# Patient Record
Sex: Female | Born: 1970 | Race: Black or African American | Hispanic: No | Marital: Married | Smoking: Never smoker
Health system: Southern US, Community
[De-identification: ages and names within clinical notes are randomized; demographics above are authoritative.]

## PROBLEM LIST (undated history)

## (undated) DIAGNOSIS — N870 Mild cervical dysplasia: Secondary | ICD-10-CM

## (undated) DIAGNOSIS — E221 Hyperprolactinemia: Secondary | ICD-10-CM

## (undated) DIAGNOSIS — E210 Primary hyperparathyroidism: Secondary | ICD-10-CM

## (undated) DIAGNOSIS — K429 Umbilical hernia without obstruction or gangrene: Secondary | ICD-10-CM

## (undated) HISTORY — DX: Umbilical hernia without obstruction or gangrene: K42.9

## (undated) HISTORY — PX: OTHER SURGICAL HISTORY: SHX169

## (undated) HISTORY — DX: Hyperprolactinemia: E22.1

## (undated) HISTORY — DX: Mild cervical dysplasia: N87.0

## (undated) HISTORY — DX: Primary hyperparathyroidism: E21.0

## (undated) HISTORY — DX: Hypocalcemia: E83.51

---

## 2003-01-23 HISTORY — PX: PARATHYROIDECTOMY: SHX19

## 2003-10-07 ENCOUNTER — Ambulatory Visit (HOSPITAL_COMMUNITY): Admission: RE | Admit: 2003-10-07 | Discharge: 2003-10-08 | Payer: Self-pay | Admitting: Surgery

## 2003-10-07 ENCOUNTER — Encounter (INDEPENDENT_AMBULATORY_CARE_PROVIDER_SITE_OTHER): Payer: Self-pay | Admitting: *Deleted

## 2003-11-22 ENCOUNTER — Other Ambulatory Visit: Admission: RE | Admit: 2003-11-22 | Discharge: 2003-11-22 | Payer: Self-pay | Admitting: Obstetrics and Gynecology

## 2004-01-22 ENCOUNTER — Emergency Department (HOSPITAL_COMMUNITY): Admission: EM | Admit: 2004-01-22 | Discharge: 2004-01-23 | Payer: Self-pay | Admitting: Emergency Medicine

## 2004-02-22 ENCOUNTER — Encounter: Admission: RE | Admit: 2004-02-22 | Discharge: 2004-02-22 | Payer: Self-pay | Admitting: Obstetrics and Gynecology

## 2004-11-23 ENCOUNTER — Other Ambulatory Visit: Admission: RE | Admit: 2004-11-23 | Discharge: 2004-11-23 | Payer: Self-pay | Admitting: Obstetrics and Gynecology

## 2005-04-05 ENCOUNTER — Other Ambulatory Visit: Admission: RE | Admit: 2005-04-05 | Discharge: 2005-04-05 | Payer: Self-pay | Admitting: Gynecology

## 2005-05-04 ENCOUNTER — Ambulatory Visit (HOSPITAL_COMMUNITY): Admission: RE | Admit: 2005-05-04 | Discharge: 2005-05-04 | Payer: Self-pay | Admitting: Gynecology

## 2005-07-30 ENCOUNTER — Encounter: Admission: RE | Admit: 2005-07-30 | Discharge: 2005-07-30 | Payer: Self-pay | Admitting: Gynecology

## 2005-10-10 ENCOUNTER — Inpatient Hospital Stay (HOSPITAL_COMMUNITY): Admission: RE | Admit: 2005-10-10 | Discharge: 2005-10-13 | Payer: Self-pay | Admitting: Gynecology

## 2005-10-10 ENCOUNTER — Encounter (INDEPENDENT_AMBULATORY_CARE_PROVIDER_SITE_OTHER): Payer: Self-pay | Admitting: *Deleted

## 2005-11-21 ENCOUNTER — Other Ambulatory Visit: Admission: RE | Admit: 2005-11-21 | Discharge: 2005-11-21 | Payer: Self-pay | Admitting: Gynecology

## 2006-09-09 ENCOUNTER — Encounter: Admission: RE | Admit: 2006-09-09 | Discharge: 2006-09-09 | Payer: Self-pay | Admitting: Surgery

## 2006-12-02 ENCOUNTER — Other Ambulatory Visit: Admission: RE | Admit: 2006-12-02 | Discharge: 2006-12-02 | Payer: Self-pay | Admitting: Gynecology

## 2007-01-03 ENCOUNTER — Encounter: Admission: RE | Admit: 2007-01-03 | Discharge: 2007-01-03 | Payer: Self-pay | Admitting: Endocrinology

## 2007-03-27 ENCOUNTER — Encounter (INDEPENDENT_AMBULATORY_CARE_PROVIDER_SITE_OTHER): Payer: Self-pay | Admitting: Surgery

## 2007-03-27 ENCOUNTER — Ambulatory Visit (HOSPITAL_COMMUNITY): Admission: RE | Admit: 2007-03-27 | Discharge: 2007-03-28 | Payer: Self-pay | Admitting: Surgery

## 2007-12-10 ENCOUNTER — Ambulatory Visit: Payer: Self-pay | Admitting: Gynecology

## 2007-12-10 ENCOUNTER — Encounter: Payer: Self-pay | Admitting: Gynecology

## 2007-12-10 ENCOUNTER — Other Ambulatory Visit: Admission: RE | Admit: 2007-12-10 | Discharge: 2007-12-10 | Payer: Self-pay | Admitting: Gynecology

## 2008-02-03 ENCOUNTER — Encounter: Admission: RE | Admit: 2008-02-03 | Discharge: 2008-02-03 | Payer: Self-pay | Admitting: Gynecology

## 2008-02-09 ENCOUNTER — Encounter: Admission: RE | Admit: 2008-02-09 | Discharge: 2008-02-09 | Payer: Self-pay | Admitting: Gynecology

## 2008-02-11 ENCOUNTER — Ambulatory Visit: Payer: Self-pay | Admitting: Gynecology

## 2008-03-22 ENCOUNTER — Ambulatory Visit: Payer: Self-pay | Admitting: Gynecology

## 2008-03-23 ENCOUNTER — Encounter: Admission: RE | Admit: 2008-03-23 | Discharge: 2008-03-23 | Payer: Self-pay | Admitting: Gynecology

## 2008-03-30 ENCOUNTER — Ambulatory Visit: Payer: Self-pay | Admitting: Gynecology

## 2008-05-03 ENCOUNTER — Ambulatory Visit: Payer: Self-pay | Admitting: Gynecology

## 2008-07-09 ENCOUNTER — Ambulatory Visit: Payer: Self-pay | Admitting: Gynecology

## 2008-08-17 ENCOUNTER — Ambulatory Visit: Payer: Self-pay | Admitting: Gynecology

## 2008-09-28 ENCOUNTER — Ambulatory Visit: Payer: Self-pay | Admitting: Gynecology

## 2008-10-09 ENCOUNTER — Ambulatory Visit (HOSPITAL_COMMUNITY): Admission: RE | Admit: 2008-10-09 | Discharge: 2008-10-09 | Payer: Self-pay | Admitting: Gynecology

## 2008-11-30 ENCOUNTER — Ambulatory Visit: Payer: Self-pay | Admitting: Gynecology

## 2008-12-20 ENCOUNTER — Ambulatory Visit: Payer: Self-pay | Admitting: Gynecology

## 2008-12-20 ENCOUNTER — Other Ambulatory Visit: Admission: RE | Admit: 2008-12-20 | Discharge: 2008-12-20 | Payer: Self-pay | Admitting: Gynecology

## 2009-02-08 ENCOUNTER — Ambulatory Visit: Payer: Self-pay | Admitting: Gynecology

## 2009-04-04 ENCOUNTER — Ambulatory Visit: Payer: Self-pay | Admitting: Gynecology

## 2009-04-04 ENCOUNTER — Other Ambulatory Visit: Admission: RE | Admit: 2009-04-04 | Discharge: 2009-04-04 | Payer: Self-pay | Admitting: Gynecology

## 2009-12-21 ENCOUNTER — Ambulatory Visit: Payer: Self-pay | Admitting: Gynecology

## 2009-12-21 ENCOUNTER — Other Ambulatory Visit
Admission: RE | Admit: 2009-12-21 | Discharge: 2009-12-21 | Payer: Self-pay | Source: Home / Self Care | Admitting: Gynecology

## 2009-12-27 ENCOUNTER — Ambulatory Visit: Payer: Self-pay | Admitting: Gynecology

## 2010-02-12 ENCOUNTER — Encounter: Payer: Self-pay | Admitting: Surgery

## 2010-06-06 NOTE — Op Note (Signed)
[name], [name]                  ACCOUNT NO.:  [id number]   MEDICAL RECORD NO.:  [id number]          PATIENT TYPE:  OIB   LOCATION:  2550                         FACILITY:  [facility]   PHYSICIAN:  Velora Heckler, MD      DATE OF BIRTH:  [birth year]   DATE OF PROCEDURE:  03/27/2007  DATE OF DISCHARGE:                               OPERATIVE REPORT   PREOPERATIVE DIAGNOSIS:  Primary hyperparathyroidism, recurrent   POSTOPERATIVE DIAGNOSIS:  Primary hyperparathyroidism, recurrent   PROCEDURE:  1. Neck exploration.  2. Right superior parathyroidectomy (adenoma)  3. Left superior parathyroidectomy (hyperplasia)  4. Autotransplantation parathyroid tissue to the left      sternocleidomastoid muscle.   SURGEON:  Velora Heckler, MD, FACS   ANESTHESIA:  General per Dr. Sheldon Silvan.   ESTIMATED BLOOD LOSS:  Minimal.   PREPARATION:  Betadine.   COMPLICATIONS:  None.   INDICATIONS:  Patient is 40 year old black female well known to my  practice.  She had undergone minimally invasive right inferior  parathyroidectomy in October 2005 for a 1000 mg 1.8 cm parathyroid  adenoma.  Following the procedure she had persistent elevated  parathyroid and calcium levels.  Follow-up sestamibi scan demonstrated  increased uptake in the left inferior position and a second adenoma was  suspected.  The patient now comes to surgery for exploration and  resection.   BODY OF REPORT:  Procedure was done in OR #16 at [facility].  The patient was brought to the operating room, placed in  supine position on the operating room table.  Following administration  of general anesthesia the patient is positioned and then prepped and  draped in the usual strict aseptic fashion.  After ascertaining that an  adequate level of anesthesia been achieved, a left inferior neck  incision was made a #15 blade.  Dissection was carried through  subcutaneous tissues and platysma.  Hemostasis was obtained  with  electrocautery.  Skin flaps were elevated cephalad and caudad and a  Weitlaner retractor was placed for exposure.  Strap muscles were incised  in the midline and reflected laterally.  Inferior pole of the left  thyroid lobe was exposed.  Dissection reveals a nodular density adherent  to the trachea just below the inferior pole of the left thyroid lobe.  This was gently dissected out taking care to avoid the recurrent nerve  which is visualized.  Nodule was excised and submitted to pathology.  Dr. Star Age evaluated it and the stated that this represented benign  thyroid tissue.  Further exploration along the tracheoesophageal groove  was unrevealing.  Left thyroid lobe was mobilized and dissection behind  the lobe revealed a enlarged appearing gland which was above the level  of the inferior thyroid artery likely representing a left superior  parathyroid.  However, it was dissected out.  Vascular structures were  divided between small hemoclips.  Gland was excised.  It appeared to be  approximately 1 cm in greatest dimension.  It was submitted to pathology  where Dr. Star Age confirmed hyperplastic parathyroid tissue  but not  consistent with parathyroid adenoma.  Therefore, further dissection was  performed in the tracheoesophageal groove and inferiorly.  The  thyrothymic tract was dissected out beyond the level of the innominate  vein on the left.  It was resected and submitted separately to pathology  for review.  No parathyroid tissue was grossly identified.  Further  dissection included opening the carotid sheath.  The esophagus was  skeletonized.  The esophagus was then mobilized and upon full  mobilization of the esophagus, an enlarged parathyroid gland was noted  in the retroesophageal space on the precervical fascia.  This was  mobilized.  It was gently dissected out.  However its vascular pedicle  tracked upwards and to the right.  This gland likely represents a right   superior parathyroid gland which she has dropped into the space behind  the esophagus and developed an adenoma.  It was resected in its entirety  with hemostasis obtained between Ligaclips.   Due to concerns over possibly rendering the patient with no viable  parathyroid tissue, a small section of parathyroid tissue is excised off  of the gland containing the adenoma.  The tissue is selected from the  vascular pedicle end of the gland away from the enlarged portion of  representing adenoma.  This tissue is then sectioned into 1 mm fragments  and six of these fragments were implanted into the left  sternocleidomastoid muscle and secured with a 3-0 Vicryl figure-of-eight  suture marked with two medium hemoclips at the site.  The site is  immediately beneath the lateral portion of the incision on the skin.   The gland is submitted to pathology and Dr. Berneta Levins confirms  parathyroid tissue consistent with parathyroid adenoma.   Good hemostasis was obtained throughout the neck.  Surgicel was placed  throughout the operative field.  Strap muscles are repaired with  interrupted 3-0 Vicryl sutures.  Strap muscles were reapproximated in  the midline with interrupted 3-0 Vicryl sutures.  Platysma was closed  with interrupted 3-0 Vicryl sutures.  Skin was anesthetized with local  Marcaine.  Skin is closed with running 4-0 Monocryl subcuticular suture.  Wound is washed and dried and Benzoin Steri-Strips were applied.  Sterile dressings were applied.  The patient is awakened from anesthesia  and brought to the recovery room in stable condition.  The patient  tolerated the procedure well.      Velora Heckler, MD  Electronically Signed     TMG/MEDQ  D:  03/27/2007  T:  03/27/2007  Job:  [id number]   cc:   Gaetano Hawthorne. Lily Peer, M.D.  Veverly Fells. Altheimer, M.D.  Jonita Albee, M.D.

## 2010-06-09 NOTE — Op Note (Signed)
[name], [name]                  ACCOUNT NO.:  [id number]   MEDICAL RECORD NO.:  [id number]          PATIENT TYPE:  INP   LOCATION:  9139                          [facility]   PHYSICIAN:  Juan H. Lily Peer, M.D.DATE OF BIRTH:  1970-12-11   DATE OF PROCEDURE:  10/10/2005  DATE OF DISCHARGE:                                 OPERATIVE REPORT   SURGEON:  Juan H. Lily Peer, M.D.   FIRST ASSISTANT:  Ivor Costa. Farrel Gobble, M.D.   INDICATIONS FOR OPERATION:  40 year old gravida 3, para 2, abortus 1 with  prior cesarean section for repeat.  Fetus in the frank breech presentation  along with symmetrical intrauterine growth restriction. The patient also  gestational diabetic on oral hypoglycemic agents.   PREOPERATIVE DIAGNOSIS:  1. Term intrauterine pregnancy.  2. Gestational diabetes.  3. Previous cesarean section.  4. Breech presentation.  5. Symmetrical IUGR.   POSTOPERATIVE DIAGNOSIS:  1. Term intrauterine pregnancy.  2. Gestational diabetes.  3. Previous cesarean section.  4. Breech presentation.  5. Symmetrical IUGR.   PROCEDURE:  Repeat lower uterine segment transverse cesarean section.   FINDINGS:  A viable female infant, Apgars of 09/09 with a weight of 5 pounds 2  ounces, nuchal cord x1, clear amniotic fluid.  Normal maternal pelvic  anatomy.   DESCRIPTION OF ANESTHESIA:  Was spinal.   DESCRIPTION OF OPERATION:  After the patient adequately counseled she was  taken to the operating room where she underwent successful spinal  anesthesia. The patient's abdomen, vagina and perineum were prepped and  draped in usual sterile fashion.  A Foley catheter had been inserted in  effort to monitor urinary output.  After adequate spinal anesthesia level  was obtained, the patient was placed in the supine position.  The abdomen  was prepped and draped in usual sterile fashion.  A Pfannenstiel skin  incision was made adjacent to previous Pfannenstiel scar.  Of note 0.25%  Marcaine  for 10 mL was infiltrated into the planned cesarean section  incision site for postop analgesia.  The incision was carried out from the  skin down to subcutaneous tissue down to the rectus fascia whereby midline  nick was made.  The fascia was incised in transverse fashion.  The midline  raphe was entered cautiously and peritoneal cavity was entered cautiously.  The bladder flap was established.  The lower uterine segment was incised in  transverse fashion.  Clear amniotic fluid was present and the newborn was  delivered in frank breech presentation and nuchal cord was manually reduced.  Newborn gave immediate cry.  The nasopharyngeal area was bulb suctioned and  the cord was doubly clamped and excised and was shown to the parents and was  passed off to the neonatologist who were in attendance and gave the above-  mentioned parameters.  After cord blood was obtained, the placenta and  umbilical cord were removed from the intrauterine cavity and submitted for  histological evaluation.  Pitocin drip was started.  The patient received a  gram of Ancef. The uterus was exteriorized.  Intrauterine cavity was swept  clear  of remaining products of conception.  The lower uterine transverse  incision was closed in a running locking stitch with 0 Vicryl suture  followed by second layer with 0 Vicryl suture in imbricating manner.  The  uterus was then placed back in the pelvic cavity. Pelvic cavity was  copiously irrigated with normal saline solution and after adequate  hemostasis the closure was started.  The visceral peritoneum was not  reapproximated but the rectus fascia was closed with running stitch of 0  Vicryl suture.  Subcutaneous bleeders were Bovie cauterized.  The skin was  reapproximated with subcuticular stitch of 3-0 Vicryl suture and Steri-  Strips and pressure dressing.  The patient was transferred to recovery room  with stable vital signs.  Blood loss from procedure was 900 mL, IV  fluid  3500 mL lactated Ringer.  Urine output 100 mL.      Juan H. Lily Peer, M.D.  Electronically Signed     JHF/MEDQ  D:  10/10/2005  T:  10/11/2005  Job:  [id number]

## 2010-06-09 NOTE — Op Note (Signed)
[name], [name]                            ACCOUNT NO.:  [id number]   MEDICAL RECORD NO.:  [id number]                   PATIENT TYPE:  OIB   LOCATION:  NA                                   FACILITY:  [facility]   PHYSICIAN:  Velora Heckler, M.D.                DATE OF BIRTH:  [birth year]   DATE OF PROCEDURE:  10/07/2003  DATE OF DISCHARGE:                                 OPERATIVE REPORT   PREOPERATIVE DIAGNOSIS:  Primary hyperparathyroidism.   POSTOPERATIVE DIAGNOSIS:  Primary hyperparathyroidism.   OPERATION PERFORMED:  Minimally invasive parathyroidectomy (right inferior  gland).   SURGEON:  Velora Heckler, M.D.   ASSISTANT:  Currie Paris, M.D.   ANESTHESIA:  General.   ESTIMATED BLOOD LOSS:  Minimal.   PREPARATION:  Betadine.   COMPLICATIONS:  None.   INDICATIONS FOR PROCEDURE:  The patient is a 40 year old black female from  [place] who presents with primary hyperparathyroidism at the request of Jonita Albee, M.D. from [facility].  Laboratory studies showed an  elevated serum calcium level of 11.2 with an intact PTH level of 90.  Sestamibi scan was performed at [facility] and showed increased  uptake in the right interior position consistent with parathyroid adenoma.  The patient now comes to surgery for minimally invasive resection.   DESCRIPTION OF PROCEDURE:  The procedure was done in operating room #17 at  the [facility].  The patient was brought to the  operating room and placed in supine position on the operating room table.  Following administration of general anesthesia, the patient was positioned  and then prepped and draped in the usual strict aseptic fashion.  After  ascertaining that an adequate level of anesthesia had been obtained, the  neck was scanned with a Neoprobe.  A point in the right inferior neck was  detected with increased levels of radioactivity.  A 2.5 cm incision was made  over this region  with the #11 blade.  Dissection was carried down through  subcutaneous tissues and platysma.  Skin flaps were developed.  Strap  muscles were incised in the midline and reflected laterally.  Using the  Neoprobe for guidance, the parathyroid gland was identified.  It was down  relatively deep in the tracheoesophageal groove just medial to the right  carotid artery.  It was carefully dissected out.  Vascular pedicle was  divided between medium LigaClips.  Venous tributary was divided between  small LigaClips.  The gland was excised completely.  Ex vivo it has  radioactive counts in excess of 200% of background.  It was submitted in its  entirety to pathology where frozen section confirmed parathyroid tissue  consistent with adenoma.  Good hemostasis was noted.  Neck was irrigated.  Surgicel was placed in the  region where the parathyroid was resected.  Good hemostasis was noted.  Strap muscles  were reapproximated in the midline with interrupted 3-0 Vicryl  sutures.  Platysma was closed with interrupted 3-0 Vicryl sutures.  Skin was  closed with interrupted 4-0  Vicryl subcuticular sutures.  The wound was washed and dried.  Benzoin and  Steri-Strips were applied.  Sterile dressings were applied.  The patient was  awakened from anesthesia and brought to the recovery room in stable  condition.  The patient tolerated the procedure well.                                               Velora Heckler, M.D.    TMG/MEDQ  D:  10/07/2003  T:  10/07/2003  Job:  [id number]   cc:   Jonita Albee, M.D.  [facility]  [address]  [place]  [place] [address]  Fax: [phone]

## 2010-06-09 NOTE — H&P (Signed)
NAMERaynelle Berry               ACCOUNT NO.:  [id number]   MEDICAL RECORD NO.:  [id number]          PATIENT TYPE:  AMB   LOCATION:                                [facility]   PHYSICIAN:  Juan H. Lily Peer, M.D.     DATE OF BIRTH:   DATE OF ADMISSION:  10/10/2005  DATE OF DISCHARGE:                                HISTORY & PHYSICAL   CHIEF COMPLAINT:  1. Term intrauterine pregnancy at __________.  DICTATION ENDS HERE.      Juan H. Lily Peer, M.D.  Electronically Signed     JHF/MEDQ  D:  10/09/2005  T:  10/09/2005  Job:  [id number]

## 2010-06-09 NOTE — H&P (Signed)
[name], [name]                  ACCOUNT NO.:  [id number]   MEDICAL RECORD NO.:  [id number]          PATIENT TYPE:  AMB   LOCATION:  SDC                           [facility]   PHYSICIAN:  Juan H. Lily Peer, M.D.DATE OF BIRTH:  01-28-1970   DATE OF ADMISSION:  DATE OF DISCHARGE:                                HISTORY & PHYSICAL   CHIEF COMPLAINT:  1. Term intrauterine pregnancy at 37-and-a-half weeks estimated      gestational age.  2. Gestational diabetic, on oral hypoglycemic agent.  3. Previous cesarean section, elective repeat.  4. Breech presentation.  5. Symmetrical intrauterine growth restriction.   HISTORY:  The patient is a 40 year old gravida 3, para 2, AB 1 [place]  patient who her first pregnancy had resulted in a fetal death at the time of  cesarean section, no etiology ever established.  Subsequently she delivered  successfully vaginally after a previous cesarean section.  The patient with  prior history of female circumcision in her country.  The patient this  pregnancy had been offered first trimester screening which she declined.  She was also offered alpha-fetoprotein screening as well as genetic  amniocentesis and declined as well.  The patient was diagnosed with  gestational diabetes during this pregnancy and had been placed on glyburide.  She was taking 5 mg daily and at times due to low blood sugars had decrease  to 2.5.  She had also been followed with antepartum testing with twice-a-  week NSTs and ultrasound.  She was identified as having a symmetrical IUGR  at 34-and-a-half weeks gestation and was monitored closely, good interval  growth.  For this reason, the patient is being scheduled for a cesarean  section at 37-and-a-half weeks gestation.  The patient with prior history  prior to her pregnancy of pituitary microadenoma, had no complaints during  her pregnancy.  The patient's most recent ultrasound demonstrated the baby  to be in a frank breech  presentation at weight of 5 pounds 6 ounces and 11th  percentile on the growth curve.  The patient undecided yet whether to  proceed with tubal sterilization.  Will ask right before her surgery.   PAST MEDICAL HISTORY:  The patient with a cesarean section for breech baby  died 30 minutes after delivery in [place].  Subsequent pregnancy in 2002 had a  successful VBAC in [place] as well.  The patient denies any allergies.   MEDICATIONS:  Consist of Glyburide 5 mg daily along with prenatal vitamins.   REVIEW OF SYSTEMS:  See [employer] form.   PHYSICAL EXAMINATION:  GENERAL:  Well-developed, well-nourished female.  VITAL SIGNS:  Blood pressure 118/78, weight 188 pounds.  Urine was negative  for protein and glucose.  HEENT:  Unremarkable.  NECK:  Supple, trachea midline, no carotid bruits, no thyromegaly.  LUNGS:  Clear to auscultation without rhonchi or wheezes.  HEART:  Regular rate and rhythm, no murmurs or gallops.  BREAST:  Exam not done.  ABDOMEN:  Gravid uterus, breech presentation by [facility] maneuver.  PELVIC:  Cervix long, closed, and posterior.  EXTREMITIES:  DTRs 1+, negative clonus.   PRENATAL LABORATORY DATA:  A positive blood type, negative antibody screen.  VDRL was nonreactive.  Rubella immune.  Hepatitis B surface antigen and HIV  were negative.  Diabetes screen was normal.  GBS culture was positive.  The  patient declined cystic fibrosis screening, alpha-fetoprotein, and genetic  amniocentesis.  The patient had normal hemoglobin electrophoresis.   ASSESSMENT:  A 40 year old gravida 3, para 2, abortus 1; advanced maternal  age; at 53-and-a-half weeks estimated gestational age with symmetrical  intrauterine growth restriction.  The patient with gestational diabetes on  oral hypoglycemic agent consisting of glyburide 5 mg daily.  The patient  with previous cesarean section and successful VBAC, with the pregnancy now  at the time of evaluation for IUGR was found that the fetus  was in the frank  breech presentation on scan done September 11.  Will ultrasound the patient  shortly before her planned cesarean section to make sure the fetus is still  in the breech presentation.  The risks, benefits, and pros and cons of the  operation were discussed with the patient, all questions were answered and  will follow accordingly.   PLAN:  The patient scheduled for a repeat cesarean section on Wednesday,  October 10, 2005, at 10 a.m.  Will ask the patient if she would like to  have a tubal sterilization procedure at the same time so the appropriate  consent form may be signed.      Juan H. Lily Peer, M.D.  Electronically Signed     JHF/MEDQ  D:  10/09/2005  T:  10/09/2005  Job:  [id number]

## 2010-06-09 NOTE — Discharge Summary (Signed)
NAMEPAITLYN, [name]                  ACCOUNT NO.:  [id number]   MEDICAL RECORD NO.:  [id number]          PATIENT TYPE:  INP   LOCATION:  9139                          [facility]   PHYSICIAN:  Ivor Costa. Farrel Gobble, M.D. DATE OF BIRTH:  September 13, 1970   DATE OF ADMISSION:  10/10/2005  DATE OF DISCHARGE:  10/13/2005                                 DISCHARGE SUMMARY   PRINCIPAL DIAGNOSIS:  Term pregnancy with prior cesarean section.   ADDITIONAL DIAGNOSIS:  Breech and gestational diabetes.   PRINCIPAL PROCEDURE:  Was an elective repeat cesarean section low flap  transverse.   HOSPITAL COURSE:  Refer to the dictated H&P.  The patient presented on the  morning of 09/19 and underwent a repeat cesarean section low flap transverse  done by Dr. Lily Peer under spinal anesthesia.  Estimated blood loss of 900  mL for delivery of a viable female with birth weight of 5 pounds 10, Apgars  09/09, the patient was then transferred to recovery room in the postpartum  floor in due fashion.  The postoperative course was unremarkable.  The  patient remained afebrile with vitals stable throughout.  Her blood  pressures were slightly elevated, however, PIH labs were normal.  By postop  day #3 the patient is tolerating regular diet, oral analgesics and was ready  for discharge.  Postop exam she was afebrile, vitals were stable.  Her  abdomen was soft, nontender.  Incision was clean, dry, intact.  The uterus  was firm below the umbilicus.  Extremities was negative.  The patient was  discharged home in stable condition.  She was given a prescription for  Percocet 5 one to two q.4 h p.r.n. #40.  She will mix that with over-the-  counter Motrin instructed to follow up in the office in 6 weeks.      Ivor Costa. Farrel Gobble, M.D.  Electronically Signed     THL/MEDQ  D:  10/13/2005  T:  10/16/2005  Job:  [id number]

## 2010-10-13 LAB — URINALYSIS, ROUTINE W REFLEX MICROSCOPIC
Bilirubin Urine: NEGATIVE
Glucose, UA: NEGATIVE
Hgb urine dipstick: NEGATIVE
Ketones, ur: NEGATIVE
Nitrite: NEGATIVE
Protein, ur: NEGATIVE
Specific Gravity, Urine: 1.01
Urobilinogen, UA: 0.2
pH: 7

## 2010-10-13 LAB — CBC
HCT: 40.8
Hemoglobin: 13.1
MCHC: 32.1
MCV: 77.5 — ABNORMAL LOW
Platelets: 282
RBC: 5.26 — ABNORMAL HIGH
RDW: 14
WBC: 8.3

## 2010-10-13 LAB — DIFFERENTIAL
Basophils Absolute: 0
Basophils Relative: 0
Eosinophils Absolute: 0.1
Eosinophils Relative: 1
Lymphocytes Relative: 26
Lymphs Abs: 2.2
Monocytes Absolute: 0.5
Monocytes Relative: 7
Neutro Abs: 5.4
Neutrophils Relative %: 66

## 2010-10-13 LAB — BASIC METABOLIC PANEL
BUN: 9
CO2: 30
Calcium: 10.9 — ABNORMAL HIGH
Chloride: 101
Creatinine, Ser: 0.52
GFR calc Af Amer: >60
GFR calc non Af Amer: >60
Glucose, Bld: 91
Potassium: 3.6
Sodium: 137

## 2010-10-13 LAB — PROTIME-INR
INR: 1.1
Prothrombin Time: 13.9

## 2010-10-16 LAB — CALCIUM
Calcium: 9
Calcium: 9.1
Calcium: 9.5

## 2010-10-23 ENCOUNTER — Emergency Department (HOSPITAL_COMMUNITY): Payer: 59

## 2010-10-23 ENCOUNTER — Emergency Department (HOSPITAL_COMMUNITY)
Admission: EM | Admit: 2010-10-23 | Discharge: 2010-10-23 | Disposition: A | Discharge status: Home / Self Care | Payer: 59 | Attending: Emergency Medicine | Admitting: Emergency Medicine

## 2010-10-23 DIAGNOSIS — J050 Acute obstructive laryngitis [croup]: Secondary | ICD-10-CM | POA: Insufficient documentation

## 2010-10-23 DIAGNOSIS — J029 Acute pharyngitis, unspecified: Secondary | ICD-10-CM | POA: Insufficient documentation

## 2010-10-23 LAB — RAPID STREP SCREEN (MED CTR MEBANE ONLY): Streptococcus, Group A Screen (Direct): NEGATIVE

## 2010-12-21 ENCOUNTER — Encounter: Payer: Self-pay | Admitting: Anesthesiology

## 2010-12-25 ENCOUNTER — Encounter: Payer: 59 | Admitting: Gynecology

## 2010-12-26 ENCOUNTER — Other Ambulatory Visit: Payer: Self-pay | Admitting: Gynecology

## 2010-12-26 ENCOUNTER — Encounter: Payer: Self-pay | Admitting: Gynecology

## 2010-12-26 ENCOUNTER — Other Ambulatory Visit (HOSPITAL_COMMUNITY)
Admission: RE | Admit: 2010-12-26 | Discharge: 2010-12-26 | Disposition: A | Discharge status: Home / Self Care | Payer: 59 | Source: Ambulatory Visit | Attending: Gynecology | Admitting: Gynecology

## 2010-12-26 ENCOUNTER — Ambulatory Visit (INDEPENDENT_AMBULATORY_CARE_PROVIDER_SITE_OTHER): Payer: 59 | Admitting: Gynecology

## 2010-12-26 VITALS — BP 130/80 | Ht 67.25 in | Wt 168.0 lb

## 2010-12-26 DIAGNOSIS — E229 Hyperfunction of pituitary gland, unspecified: Secondary | ICD-10-CM

## 2010-12-26 DIAGNOSIS — E221 Hyperprolactinemia: Secondary | ICD-10-CM | POA: Insufficient documentation

## 2010-12-26 DIAGNOSIS — M6208 Separation of muscle (nontraumatic), other site: Secondary | ICD-10-CM

## 2010-12-26 DIAGNOSIS — E892 Postprocedural hypoparathyroidism: Secondary | ICD-10-CM | POA: Insufficient documentation

## 2010-12-26 DIAGNOSIS — E209 Hypoparathyroidism, unspecified: Secondary | ICD-10-CM

## 2010-12-26 DIAGNOSIS — Z1231 Encounter for screening mammogram for malignant neoplasm of breast: Secondary | ICD-10-CM

## 2010-12-26 DIAGNOSIS — R634 Abnormal weight loss: Secondary | ICD-10-CM

## 2010-12-26 DIAGNOSIS — K429 Umbilical hernia without obstruction or gangrene: Secondary | ICD-10-CM | POA: Insufficient documentation

## 2010-12-26 DIAGNOSIS — E559 Vitamin D deficiency, unspecified: Secondary | ICD-10-CM | POA: Insufficient documentation

## 2010-12-26 DIAGNOSIS — Z01419 Encounter for gynecological examination (general) (routine) without abnormal findings: Secondary | ICD-10-CM

## 2010-12-26 DIAGNOSIS — M6200 Separation of muscle (nontraumatic), unspecified site: Secondary | ICD-10-CM

## 2010-12-26 DIAGNOSIS — Z8632 Personal history of gestational diabetes: Secondary | ICD-10-CM

## 2010-12-26 MED ORDER — CABERGOLINE 0.5 MG PO TABS: 0.2500 mg | ORAL_TABLET | ORAL | Status: DC

## 2010-12-26 NOTE — Progress Notes (Signed)
[name] 1970-09-20 [id number]   History:    40 y.o.  for annual exam . Patient with a interesting past medical history as follows: Patient with history of primary hyperparathyroidism which was discovered in 2005. 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 Patient then had recurrent primary hyperparathyroidism and underwent right superior parathyroidectomy (adenoma), left superior parathyroidectomy (hyperplasia), and parathyroid tissue autotransplantation to the left sternocleidomastoid in 2009. She has had hypoparathyroidism. She's also has had vitamin D deficiency and treated. She's been followed by an endocrinologist Dr. Casimiro Needle Altheimer. Patient also with history of hyperprolactinemia for which she has been on Cabergoline 0.5 mg twice a week. Her last MRI was in 2010 with high point enhancement possible small cyst measuring 5 x 7 x 6 mm near the pituitary gland unchanged since 2006. Patient had bone density study in 2010 with patient Z score below expected range for age but 1 compare with 2008 there was no significant change in bone mineral density in the spine or the left forearm compared to prior study. There was statistically significant increase in bone mineral density in the total left hip of 4.7%. Patient is currently taking calcium. Approximately 3 months ago her endocrinologist had check checked her calcium and PTH and vitamin D and patient stated was normal. Her hemoglobin was low at 10.7 and she is started iron tablets daily. Review of her record indicated she was weighing 174 is down to 168 but she is exercising more regularly.  Past medical history,surgical history, family history and social history were all reviewed and documented in the EPIC chart.  Gynecologic History Patient's last menstrual period was 11/12/2010. Contraception: none Last Pap: 2011. Results were: normal Last mammogram: 2010. Results were: normal  Obstetric  History OB History    Grav Para Term Preterm Abortions TAB SAB Ect Mult Living   3 3 2       2      # Outc Date GA Lbr Len/2nd Wgt Sex Del Anes PTL Lv   1 TRM     F SVD  No Yes   2 TRM     M SVD  No Yes   3 PAR                ROS:  Was performed and pertinent positives and negatives are included in the history.  Exam: chaperone present  BP 130/80  Ht 5' 7.25" (1.708 m)  Wt 168 lb (76.204 kg)  BMI 26.12 kg/m2  LMP 11/12/2010  Body mass index is 26.12 kg/(m^2).  General appearance : Well developed well nourished female. No acute distress HEENT: Neck supple, trachea midline, no carotid bruits, no thyroidmegaly Lungs: Clear to auscultation, no rhonchi or wheezes, or rib retractions  Heart: Regular rate and rhythm, no murmurs or gallops Breast:Examined in sitting and supine position were symmetrical in appearance, no palpable masses or tenderness,  no skin retraction, no nipple inversion, no nipple discharge, no skin discoloration, no axillary or supraclavicular lymphadenopathy Abdomen: Evidence of diastasis recti an umbilical hernia reducible  Extremities: no edema or skin discoloration or tenderness  Pelvic:  Bartholin, Urethra, Skene Glands: Within normal limits             Vagina: No gross lesions or discharge  Cervix: No gross lesions or discharge  Uterus  anteverted, normal size, shape and consistency, non-tender and mobile  Adnexa  Without masses or tenderness  Anus and perineum  normal   Rectovaginal  normal  sphincter tone without palpated masses or tenderness             Hemoccult not done     Assessment/Plan:  40 y.o. female for annual exam  patient will return back to the office later in the week for fasting lipid profile, prolactin, fasting blood sugar, and TSH. CBC done recently by her primary physician. She will continue to follow with her endocrinologist. She will be referred to the general surgeon for further evaluation of her diastasis recti and her umbilical  hernia. Requisition for mammogram was provided. She was instructed to continue monthly self breast examination. She is reported normal menstrual cycles. Will followup with a bone density study next year. She will continue her medications as previous to prescribe. Pap smear was done today as well.   Ok Edwards MD, 12:50 PM 12/26/2010

## 2010-12-28 ENCOUNTER — Other Ambulatory Visit: Payer: Self-pay | Admitting: *Deleted

## 2010-12-28 ENCOUNTER — Telehealth: Payer: Self-pay | Admitting: *Deleted

## 2010-12-28 ENCOUNTER — Ambulatory Visit (INDEPENDENT_AMBULATORY_CARE_PROVIDER_SITE_OTHER): Payer: 59 | Admitting: *Deleted

## 2010-12-28 DIAGNOSIS — Z8632 Personal history of gestational diabetes: Secondary | ICD-10-CM

## 2010-12-28 DIAGNOSIS — Z01419 Encounter for gynecological examination (general) (routine) without abnormal findings: Secondary | ICD-10-CM

## 2010-12-28 DIAGNOSIS — Z13220 Encounter for screening for lipoid disorders: Secondary | ICD-10-CM

## 2010-12-28 DIAGNOSIS — E229 Hyperfunction of pituitary gland, unspecified: Secondary | ICD-10-CM

## 2010-12-28 DIAGNOSIS — M6208 Separation of muscle (nontraumatic), other site: Secondary | ICD-10-CM

## 2010-12-28 DIAGNOSIS — R634 Abnormal weight loss: Secondary | ICD-10-CM

## 2010-12-28 DIAGNOSIS — R82998 Other abnormal findings in urine: Secondary | ICD-10-CM

## 2010-12-28 DIAGNOSIS — E221 Hyperprolactinemia: Secondary | ICD-10-CM

## 2010-12-28 NOTE — Telephone Encounter (Signed)
Message copied by Libby Maw on Thu Dec 28, 2010 12:04 PM ------      Message from: Ok Edwards      Created: Tue Dec 26, 2010  2:13 PM       Toshiyuki Fredell, please schedule a consultation for this patient with Dr. Gerrit Friends (general surgeon) for diastases recti and reducible umbilical hernia. Thank you

## 2010-12-28 NOTE — Telephone Encounter (Signed)
Patient informed appointment set up with Dr. Gerrit Friends on 02/05/11 @ 9:45 am.

## 2010-12-29 LAB — GLUCOSE, RANDOM: Glucose, Bld: 89 mg/dL (ref 70–99)

## 2011-01-04 ENCOUNTER — Other Ambulatory Visit: Payer: Self-pay | Admitting: *Deleted

## 2011-01-04 DIAGNOSIS — R7989 Other specified abnormal findings of blood chemistry: Secondary | ICD-10-CM

## 2011-01-29 ENCOUNTER — Ambulatory Visit
Admission: RE | Admit: 2011-01-29 | Discharge: 2011-01-29 | Disposition: A | Discharge status: Home / Self Care | Payer: 59 | Source: Ambulatory Visit | Attending: Gynecology | Admitting: Gynecology

## 2011-01-29 DIAGNOSIS — Z1231 Encounter for screening mammogram for malignant neoplasm of breast: Secondary | ICD-10-CM

## 2011-01-31 ENCOUNTER — Encounter (INDEPENDENT_AMBULATORY_CARE_PROVIDER_SITE_OTHER): Payer: Self-pay | Admitting: Surgery

## 2011-02-01 ENCOUNTER — Other Ambulatory Visit: Payer: Self-pay | Admitting: Gynecology

## 2011-02-01 ENCOUNTER — Other Ambulatory Visit: Payer: 59 | Admitting: Gynecology

## 2011-02-01 DIAGNOSIS — R7989 Other specified abnormal findings of blood chemistry: Secondary | ICD-10-CM

## 2011-02-02 LAB — PROLACTIN: Prolactin: 33.3 ng/mL

## 2011-02-05 ENCOUNTER — Encounter (INDEPENDENT_AMBULATORY_CARE_PROVIDER_SITE_OTHER): Payer: Self-pay | Admitting: Surgery

## 2011-02-05 ENCOUNTER — Ambulatory Visit (INDEPENDENT_AMBULATORY_CARE_PROVIDER_SITE_OTHER): Payer: 59 | Admitting: Surgery

## 2011-02-05 VITALS — BP 126/80 | HR 68 | Temp 96.8°F | Resp 16 | Ht 67.5 in | Wt 171.0 lb

## 2011-02-05 DIAGNOSIS — K429 Umbilical hernia without obstruction or gangrene: Secondary | ICD-10-CM

## 2011-02-05 NOTE — Progress Notes (Signed)
Chief Complaint  Patient presents with  . Umbilical Hernia    Evaluate umbilical hernia - referral from Dr. Reynaldo Minium   HISTORY: Patient is a 41 year old female well known to my surgical practice from her prior procedures for primary hyperparathyroidism. Recently she was noted by her gynecologist to have a small umbilical hernia and a large rectus diastases. She is referred at this time for evaluation of the umbilical hernia.  Patient has noted the rectus diastases since the birth of her first child. She is asymptomatic. She has had no change in her bowel habits. She denies any pain. She denies any other abdominal surgery except for cesarean section.  Past Medical History  Diagnosis Date  . CIN I (cervical intraepithelial neoplasia I)   . Diabetes mellitus     Gestational  . Hyperparathyroidism, primary   . Hyperprolactinemia   . Hypocalcemia   . Umbilical hernia      Current Outpatient Prescriptions  Medication Sig Dispense Refill  . cabergoline (DOSTINEX) 0.5 MG tablet Take 0.5 tablets (0.25 mg total) by mouth 2 (two) times a week.  10 tablet  11  . calcium carbonate (OS-CAL) 600 MG TABS Take 600 mg by mouth 2 (two) times daily with a meal.        . calcium carbonate (TUMS - DOSED IN MG ELEMENTAL CALCIUM) 500 MG chewable tablet Chew 1 tablet by mouth daily.        . cholecalciferol (VITAMIN D) 1000 UNITS tablet Take 1,000 Units by mouth daily.        . ferrous sulfate 325 (65 FE) MG tablet Take 325 mg by mouth daily with breakfast.           No Known Allergies   History reviewed. No pertinent family history.   History   Social History  . Marital Status: Married    Spouse Name: N/A    Number of Children: N/A  . Years of Education: N/A   Social History Main Topics  . Smoking status: Never Smoker   . Smokeless tobacco: Never Used  . Alcohol Use: No  . Drug Use: No  . Sexually Active: Yes -- Female partner(s)    Birth Control/ Protection: None   Other Topics  Concern  . None   Social History Narrative  . None    REVIEW OF SYSTEMS - PERTINENT POSITIVES ONLY: Denies pain. Denies signs or symptoms of obstruction.   EXAM: Filed Vitals:   02/05/11 0926  BP: 126/80  Pulse: 68  Temp: 96.8 F (36 C)  Resp: 16    HEENT: normocephalic; pupils equal and reactive; sclerae clear; dentition good; mucous membranes moist NECK:  Well healed incision with small keloid on left; no nodules; symmetric on extension; no palpable anterior or posterior cervical lymphadenopathy; no supraclavicular masses; no tenderness CHEST: clear to auscultation bilaterally without rales, rhonchi, or wheezes CARDIAC: regular rate and rhythm without significant murmur; peripheral pulses are full ABDOMEN: Soft, well healed incision; large rectus diastasis; small reducible umbilical hernia with fascial defect approx 1 cm EXT:  non-tender without edema; no deformity NEURO: no gross focal deficits; no sign of tremor   LABORATORY RESULTS: See E-Chart for most recent results   RADIOLOGY RESULTS: See E-Chart or I-Site for most recent results   IMPRESSION: #1 reducible umbilical hernia, asymptomatic #2 rectus diastases #3 hypo-parathyroidism, postsurgical  PLAN: The patient and I discussed rectus diastases and umbilical hernia management. At this point she is asymptomatic. Given the small fascial defect and the relative  laxity of the abdominal wall, I think it is unlikely that the umbilical hernia will ever enlarged significantly.  At this point the patient does not desire for surgical intervention for either diagnosis. I think it is safe to observe.  Patient will return to see me as needed.  Velora Heckler, MD, FACS General & Endocrine Surgery [facility]   Visit Diagnoses: 1. Umbilical hernia     Primary Care Physician: Tally Due, MD, MD

## 2011-02-05 NOTE — Patient Instructions (Signed)
Umbilical Herniorrhaphy A herniorrhaphy is surgery to repair a hernia. A hernia is a gap or weakness in the muscles of your abdomen. Umbilical means that your hernia is in the area around your belly button. You might be able to feel a small bulge in your abdomen where the hernia is. You might also have pain or discomfort. If the hernia is not repaired, the gap could get bigger. Your intestines could get trapped in the gap. This can be painful. It also can lead to other health problems, such as blocked intestines. LET YOUR CAREGIVER KNOW ABOUT:  Any allergies.   All medications you are taking, including:   Herbs, eyedrops, over-the-counter medications and cream   Blood thinners (anticoagulants), aspirin or other drugs that could affect blood clotting.   Use of steroids (by mouth or as creams).   Previous problems with anesthetics, including local anesthetics.   Possibility of pregnancy, if this applies.   Any history of blood clots.   Any history of bleeding or other blood problems.   Previous surgery.   Smoking history.   Other health problems.  RISKS AND COMPLICATIONS  Short-term possibilities include:   Pain.   Excessive bleeding.   Hematoma. This is a pooling of blood under the wound.   Infection at the surgery site or of the mesh.   Numbness at the surgery site.   Swelling and bruising.   Slow healing.   Blood clots.   Intestine or bowel damage. This is rare.   Longer-term possibilities include:   Scarring.   Skin damage.   The need for additional surgery.   Another hernia.  BEFORE THE PROCEDURE  Stop using aspirin and non-steroidal anti-inflammatory drugs (NSAIDs) for pain relief. Also stop taking vitamin E. If possible, do this two weeks in advance.   If you take blood-thinners, ask your healthcare provider when you should stop taking them.   Do not eat or drink for about 8 hours before your surgery.   You might be asked to shower or wash with a  special antibacterial soap before the procedure.   Wear clothes that will be easy to put on after the surgery.   Arrive at least an hour before the surgery, or whenever your surgeon recommends. This will give you time to check in and fill out any needed paperwork.   This surgery is usually an outpatient procedure, so you will be able to go home the same day. Less often people stay overnight in the hospital after the procedure. Ask your healthcare provider what to expect. Either way, make arrangements in advance for someone to drive you home. After an outpatient procedure, you should have someone stay with you overnight.  PROCEDURE Your procedure can be done with traditional surgery. The surgeon opens the abdomen and repairs the hernia. Or, sometimes it can be done with laparoscopic surgery. Then the procedure is done through multiple small incisions, using a camera to guide the repair. Talk with your surgeon about how the hernia will be repaired.  The preparation:   You will change into a hospital gown.   You will be given an IV. A needle will be inserted in your arm. Medication can flow directly into your body through this needle.   You might be given a sedative to help you relax.   You may be given a drug that will put you to sleep during the surgery (general anesthetic). Or, your abdomen will be numbed, and you will be drowsy but awake (local   anesthetic).   For a traditional surgery (sometimes called open surgery):   Once you are pain-free, the surgeon will make a small cut (incision) in your abdomen.   The gap in the muscle wall will be repaired. The surgeon could sew muscle together over the gap. Or, a mesh or soft screen material can be used to strengthen the area. The mesh acts as a scaffolding and the body grows new strong tissue into and around it. This new tissue is what closes the gap of the hernia and prevents it from coming back.   A drain might be put in. Fluid sometimes  collects under the wound as it heals. The drain helps get the fluid out of the area. A drain will probably be used if your hernia is large.   The surgeon will close the incision with small stitches.   For a laparoscopic surgery:   One you are pain-free, the surgeon will make a small incision in your abdomen.   A thin tube with a tiny camera attached to it will be inserted into the abdomen through the incision. What the camera "sees" is projected on a screen in the room. This gives the surgeon a good view of the hernia.   Other small incisions will be made so the surgeon can insert small tools that are used to repair the hernia.   The incisions will be closed with small stitches.  AFTER THE PROCEDURE  You will be stay in a recovery area until the anesthesia has worn off. Your blood pressure and pulse will be checked every so often.   You might be asked to get up and try walking.   Sometimes people can go home the same day. For others, an overnight stay is needed.  HOME CARE INSTRUCTIONS   Take any medication that your surgeon prescribed. Follow the directions carefully. Take all of the medication.   Ask your surgeon whether you can take over-the-counter medicines for pain, discomfort or fever. Do not take aspirin unless your healthcare team says that you should. Aspirin increases the chances of bleeding.   Do not get the incision area wet for the first few days after surgery (or until your surgeon says it is OK).   Avoid lifting heavy objects (more than 10 lbs, 4.5 kilograms) for 6 to 8 weeks after your surgery.   Expect some pain when climbing stairs for several days after surgery.   Avoid sexual activity for a few weeks. It can be uncomfortable or painful.   You should be able to drive within a few days. However, do not drive until you are no longer taking pain medicine. It can make you drowsy. It also can slow your reaction time.   You should be able to resume normal activity in  a few days. When you can return to work will depend on the type of work you do. You can go back to a desk job much sooner than you can return to work that requires physical labor. Talk about this with your healthcare provider.  SEEK MEDICAL CARE IF:   You notice blood or fluid leaking from the wound.   The area around the incision becomes red or swollen.   You are having problems urinating.   You become nauseous or throw up for more than two days after the surgery.   You develop a fever of more than 100.5 F (38.1 C).  SEEK IMMEDIATE MEDICAL CARE IF:  You develop a fever of more than   102.0 F (38.9 C). Document Released: 04/06/2008 Document Revised: 09/20/2010 Document Reviewed: 04/06/2008 Exit[employer] Patient Information 2012 [employer]. 

## 2011-05-25 ENCOUNTER — Telehealth: Payer: Self-pay | Admitting: *Deleted

## 2011-05-25 DIAGNOSIS — E221 Hyperprolactinemia: Secondary | ICD-10-CM

## 2011-05-25 MED ORDER — CABERGOLINE 0.5 MG PO TABS: 0.2500 mg | ORAL_TABLET | ORAL | Status: DC

## 2011-05-25 NOTE — Telephone Encounter (Signed)
Pt called requesting dostinex 0.5 mg to [facility] pharmacy, rx sent, 90 day supply

## 2011-05-28 NOTE — Telephone Encounter (Signed)
Pt informed

## 2011-05-29 ENCOUNTER — Telehealth: Payer: Self-pay | Admitting: *Deleted

## 2011-05-29 NOTE — Telephone Encounter (Signed)
Her combined pick up a prescription for Xanax 0.5 mg which she can take on a when necessary basis in the event of any anxiety her apprehension and we can prescribe 30 tablets. If you can call in for that would be great. Thank you

## 2011-05-29 NOTE — Telephone Encounter (Signed)
Patient states she is leaving for [place] (where she is from) on the 22nd.  Requests 10 pills of Lexapro 10mg  to have on hand for her trip.  She said it has been a few years since she has had it but has had it on hand when going to [place] when there is tension going on there. She said she usually don't take it, but would be afraid not to have it if she needed it.  Can we do that for her? Please advise

## 2011-05-30 MED ORDER — ALPRAZOLAM 0.5 MG PO TABS: 0.5000 mg | ORAL_TABLET | Freq: Three times a day (TID) | ORAL | Status: AC | PRN

## 2011-05-30 NOTE — Telephone Encounter (Signed)
Patient informed rx called in.

## 2011-05-30 NOTE — Telephone Encounter (Signed)
Lm for patient to call

## 2011-12-27 ENCOUNTER — Encounter: Payer: Self-pay | Admitting: Gynecology

## 2011-12-27 ENCOUNTER — Ambulatory Visit (INDEPENDENT_AMBULATORY_CARE_PROVIDER_SITE_OTHER): Payer: 59 | Admitting: Gynecology

## 2011-12-27 VITALS — BP 124/86 | Ht 67.0 in | Wt 177.0 lb

## 2011-12-27 DIAGNOSIS — R300 Dysuria: Secondary | ICD-10-CM

## 2011-12-27 DIAGNOSIS — N912 Amenorrhea, unspecified: Secondary | ICD-10-CM

## 2011-12-27 DIAGNOSIS — Z01419 Encounter for gynecological examination (general) (routine) without abnormal findings: Secondary | ICD-10-CM

## 2011-12-27 DIAGNOSIS — E209 Hypoparathyroidism, unspecified: Secondary | ICD-10-CM

## 2011-12-27 DIAGNOSIS — E229 Hyperfunction of pituitary gland, unspecified: Secondary | ICD-10-CM

## 2011-12-27 DIAGNOSIS — E221 Hyperprolactinemia: Secondary | ICD-10-CM

## 2011-12-27 LAB — URINALYSIS W MICROSCOPIC + REFLEX CULTURE
Bilirubin Urine: NEGATIVE
Glucose, UA: NEGATIVE mg/dL
Hgb urine dipstick: NEGATIVE
Ketones, ur: NEGATIVE mg/dL
Leukocytes, UA: NEGATIVE
Nitrite: NEGATIVE
Protein, ur: NEGATIVE mg/dL
Specific Gravity, Urine: 1.02 (ref 1.005–1.030)
Urobilinogen, UA: 0.2 mg/dL (ref 0.0–1.0)
pH: 7 (ref 5.0–8.0)

## 2011-12-27 LAB — PREGNANCY, URINE: Preg Test, Ur: NEGATIVE

## 2011-12-27 LAB — CHOLESTEROL, TOTAL: Cholesterol: 233 mg/dL — ABNORMAL HIGH (ref 0–200)

## 2011-12-27 MED ORDER — CABERGOLINE 0.5 MG PO TABS: 0.2500 mg | ORAL_TABLET | ORAL | Status: DC

## 2011-12-27 NOTE — Progress Notes (Signed)
[name] 1971-01-07 [id number]   History:    41 y.o.  for annual exam . Patient with a interesting past medical history as follows: Patient with history of primary hyperparathyroidism which was discovered in 2005. 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 Patient then had recurrent primary hyperparathyroidism and underwent right superior parathyroidectomy (adenoma), left superior parathyroidectomy (hyperplasia), and parathyroid tissue autotransplantation to the left sternocleidomastoid in 2009. She has had hypoparathyroidism.   She's also has had vitamin D deficiency and treated. Patient is currently taking vitamin D 50,000 units q. Monthly and she takes 12-16 tums  a day for her calcium source. She's been followed by an endocrinologist Dr. Casimiro Needle Altheimer. Who she saw earlier in the week and she is informed that her calcium level was 7.3 and her vitamin D was 64 and a PTH was slightly low at 4.1. She also stated her hemoglobin was 14.4 and her TSH was normal. I'm waiting to receive those office notes.   Patient also with history of hyperprolactinemia for which she has been on Cabergoline 0.5 mg twice a week. 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 Patient had bone density study in 2010 with patient Z score below expected range for age but when compared  with 2008 there was no significant change in bone mineral density in the spine or the left forearm compared to prior study. There was statistically significant increase in bone mineral density in the total left hip of 4.7%. Patient is currently taking calcium.  Patient has informed me that her last menstrual period was August 6. Office urine pregnancy test and urinalysis were both negative. Patient had not been taking her Cabergoline.   Past medical  history,surgical history, family history and social history were all reviewed and documented in the EPIC chart.  Gynecologic History Patient's last menstrual period was 08/28/2011. Contraception: none Last Pap: 2012. Results were: normal Last mammogram: 2012. Results were: normal  Obstetric History OB History    Grav Para Term Preterm Abortions TAB SAB Ect Mult Living   3 3 2       2      # Outc Date GA Lbr Len/2nd Wgt Sex Del Anes PTL Lv   1 TRM     F SVD  No Yes   2 TRM     M SVD  No Yes   3 PAR                ROS: A ROS was performed and pertinent positives and negatives are included in the history.  GENERAL: No fevers or chills. HEENT: No change in vision, no earache, sore throat or sinus congestion. NECK: No pain or stiffness. CARDIOVASCULAR: No chest pain or pressure. No palpitations. PULMONARY: No shortness of breath, cough or wheeze. GASTROINTESTINAL: No abdominal pain, nausea, vomiting or diarrhea, melena or bright red blood per rectum. GENITOURINARY: No urinary frequency, urgency, hesitancy or dysuria. MUSCULOSKELETAL: No joint or muscle pain, no back pain, no recent trauma. DERMATOLOGIC: No rash, no itching, no lesions. ENDOCRINE: No polyuria, polydipsia, no heat or cold intolerance. No recent change in weight. HEMATOLOGICAL: No anemia or easy bruising or bleeding. NEUROLOGIC: No headache, seizures, numbness, tingling or weakness. PSYCHIATRIC: No depression, no loss of interest in normal activity or change in sleep pattern.  Exam: chaperone present  BP 124/86  Ht 5\' 7"  (1.702 m)  Wt 177 lb (80.287 kg)  BMI 27.72 kg/m2  LMP 08/28/2011  Body mass index is 27.72 kg/(m^2).  General appearance : Well developed well nourished female. No acute distress HEENT: Neck supple, trachea midline, no carotid bruits, no thyroidmegaly Lungs: Clear to auscultation, no rhonchi or wheezes, or rib retractions  Heart: Regular rate and rhythm, no murmurs or gallops Breast:Examined in  sitting and supine position were symmetrical in appearance, no palpable masses or tenderness,  no skin retraction, no nipple inversion, no nipple discharge, no skin discoloration, no axillary or supraclavicular lymphadenopathy Abdomen: no palpable masses or tenderness, no rebound or guarding Extremities: no edema or skin discoloration or tenderness  Pelvic:  Bartholin, Urethra, Skene Glands: Within normal limits             Vagina: No gross lesions or discharge  Cervix: No gross lesions or discharge  Uterus  anteverted, normal size, shape and consistency, non-tender and mobile  Adnexa  Without masses or tenderness  Anus and perineum  normal   Rectovaginal  normal sphincter tone without palpated masses or tenderness             Hemoccult not indicated     Assessment/Plan:  41 y.o. female for annual exam will have her prolactin level checked today since she has history of elevated prolactin levels. Patient will be represcribed Cabergoline 0.25 mg to take 1 tablet twice a week. She will return back to the office in 8 weeks for followup in a few days prior to that she will have a bone density study as well as followup check on her prolactin to see how she is responding going back on the Cabergoline. Depending on the value we may need to proceed with a followup MRI. She denies any visual disturbances headaches or nipple discharge. We'll also do a screening cholesterol. She will continue to follow up with the endocrinologist every 6 months. We discussed importance of weightbearing exercises for osteoporosis prevention. She is currently on iron supplementation daily and recent CBC was reported to be normal. Patient is encouraged to continue her monthly self breast examination and will be scheduling her mammogram as well.   Ok Edwards MD, 1:02 PM 12/27/2011

## 2011-12-27 NOTE — Patient Instructions (Addendum)
Please remember to come in to the office to get her prolactin level checked 2-3 days before your office visit to see me. Dr. Lily Peer

## 2011-12-28 ENCOUNTER — Other Ambulatory Visit: Payer: Self-pay | Admitting: Gynecology

## 2011-12-28 DIAGNOSIS — E7800 Pure hypercholesterolemia, unspecified: Secondary | ICD-10-CM

## 2011-12-28 DIAGNOSIS — E221 Hyperprolactinemia: Secondary | ICD-10-CM

## 2011-12-28 LAB — PROLACTIN: Prolactin: 52.9 ng/mL

## 2012-01-02 ENCOUNTER — Telehealth: Payer: Self-pay | Admitting: *Deleted

## 2012-01-02 DIAGNOSIS — E221 Hyperprolactinemia: Secondary | ICD-10-CM

## 2012-01-02 MED ORDER — CABERGOLINE 0.5 MG PO TABS: 0.2500 mg | ORAL_TABLET | ORAL | Status: DC

## 2012-01-02 NOTE — Telephone Encounter (Signed)
Pt called and requested for Cabergoline 0.25 mg to take 1 tablet twice a week be sent to [facility]. rx sent.

## 2012-01-09 ENCOUNTER — Other Ambulatory Visit: Payer: Self-pay | Admitting: Gynecology

## 2012-01-09 DIAGNOSIS — Z1231 Encounter for screening mammogram for malignant neoplasm of breast: Secondary | ICD-10-CM

## 2012-01-11 ENCOUNTER — Other Ambulatory Visit: Payer: Self-pay | Admitting: *Deleted

## 2012-01-11 DIAGNOSIS — E559 Vitamin D deficiency, unspecified: Secondary | ICD-10-CM

## 2012-02-21 ENCOUNTER — Other Ambulatory Visit: Payer: 59

## 2012-02-21 ENCOUNTER — Ambulatory Visit: Payer: 59

## 2012-02-26 ENCOUNTER — Ambulatory Visit: Payer: 59

## 2012-02-26 ENCOUNTER — Other Ambulatory Visit: Payer: 59

## 2012-03-27 ENCOUNTER — Ambulatory Visit
Admission: RE | Admit: 2012-03-27 | Discharge: 2012-03-27 | Disposition: A | Discharge status: Home / Self Care | Payer: 59 | Source: Ambulatory Visit | Attending: Gynecology | Admitting: Gynecology

## 2012-03-27 DIAGNOSIS — E559 Vitamin D deficiency, unspecified: Secondary | ICD-10-CM

## 2012-03-27 DIAGNOSIS — Z1231 Encounter for screening mammogram for malignant neoplasm of breast: Secondary | ICD-10-CM

## 2012-05-11 ENCOUNTER — Encounter (HOSPITAL_COMMUNITY): Payer: Self-pay | Admitting: *Deleted

## 2012-05-11 ENCOUNTER — Emergency Department (HOSPITAL_COMMUNITY)
Admission: EM | Admit: 2012-05-11 | Discharge: 2012-05-11 | Disposition: A | Discharge status: Home / Self Care | Payer: 59 | Attending: Emergency Medicine | Admitting: Emergency Medicine

## 2012-05-11 DIAGNOSIS — R110 Nausea: Secondary | ICD-10-CM | POA: Insufficient documentation

## 2012-05-11 DIAGNOSIS — J029 Acute pharyngitis, unspecified: Secondary | ICD-10-CM | POA: Insufficient documentation

## 2012-05-11 DIAGNOSIS — R509 Fever, unspecified: Secondary | ICD-10-CM | POA: Insufficient documentation

## 2012-05-11 DIAGNOSIS — Z3202 Encounter for pregnancy test, result negative: Secondary | ICD-10-CM | POA: Insufficient documentation

## 2012-05-11 DIAGNOSIS — R05 Cough: Secondary | ICD-10-CM | POA: Insufficient documentation

## 2012-05-11 DIAGNOSIS — Z8639 Personal history of other endocrine, nutritional and metabolic disease: Secondary | ICD-10-CM | POA: Insufficient documentation

## 2012-05-11 DIAGNOSIS — Z862 Personal history of diseases of the blood and blood-forming organs and certain disorders involving the immune mechanism: Secondary | ICD-10-CM | POA: Insufficient documentation

## 2012-05-11 DIAGNOSIS — E229 Hyperfunction of pituitary gland, unspecified: Secondary | ICD-10-CM | POA: Insufficient documentation

## 2012-05-11 DIAGNOSIS — J3489 Other specified disorders of nose and nasal sinuses: Secondary | ICD-10-CM | POA: Insufficient documentation

## 2012-05-11 DIAGNOSIS — R0682 Tachypnea, not elsewhere classified: Secondary | ICD-10-CM | POA: Insufficient documentation

## 2012-05-11 DIAGNOSIS — R059 Cough, unspecified: Secondary | ICD-10-CM | POA: Insufficient documentation

## 2012-05-11 DIAGNOSIS — Z8541 Personal history of malignant neoplasm of cervix uteri: Secondary | ICD-10-CM | POA: Insufficient documentation

## 2012-05-11 DIAGNOSIS — Z8719 Personal history of other diseases of the digestive system: Secondary | ICD-10-CM | POA: Insufficient documentation

## 2012-05-11 DIAGNOSIS — Z79899 Other long term (current) drug therapy: Secondary | ICD-10-CM | POA: Insufficient documentation

## 2012-05-11 DIAGNOSIS — R000 Tachycardia, unspecified: Secondary | ICD-10-CM | POA: Insufficient documentation

## 2012-05-11 LAB — URINALYSIS, MICROSCOPIC ONLY
Bilirubin Urine: NEGATIVE
Glucose, UA: NEGATIVE mg/dL
Hgb urine dipstick: NEGATIVE
Ketones, ur: NEGATIVE mg/dL
Leukocytes, UA: NEGATIVE
Nitrite: NEGATIVE
Protein, ur: NEGATIVE mg/dL
Specific Gravity, Urine: 1.018 (ref 1.005–1.030)
Urine-Other: NONE SEEN
Urobilinogen, UA: 0.2 mg/dL (ref 0.0–1.0)
pH: 8 (ref 5.0–8.0)

## 2012-05-11 LAB — COMPREHENSIVE METABOLIC PANEL
ALT: 10 U/L (ref 0–35)
AST: 17 U/L (ref 0–37)
Albumin: 3.7 g/dL (ref 3.5–5.2)
Alkaline Phosphatase: 53 U/L (ref 39–117)
BUN: 16 mg/dL (ref 6–23)
CO2: 29 mEq/L (ref 19–32)
Calcium: 7.3 mg/dL — ABNORMAL LOW (ref 8.4–10.5)
Chloride: 101 mEq/L (ref 96–112)
Creatinine, Ser: 0.78 mg/dL (ref 0.50–1.10)
GFR calc Af Amer: >90 mL/min (ref >90)
GFR calc non Af Amer: >90 mL/min (ref >90)
Glucose, Bld: 105 mg/dL — ABNORMAL HIGH (ref 70–99)
Potassium: 3.8 mEq/L (ref 3.5–5.1)
Sodium: 140 mEq/L (ref 135–145)
Total Bilirubin: 0.2 mg/dL — ABNORMAL LOW (ref 0.3–1.2)
Total Protein: 7.1 g/dL (ref 6.0–8.3)

## 2012-05-11 LAB — CBC WITH DIFFERENTIAL/PLATELET
Basophils Absolute: 0 10*3/uL (ref 0.0–0.1)
Basophils Relative: 0 % (ref 0–1)
Eosinophils Absolute: 0.1 10*3/uL (ref 0.0–0.7)
Eosinophils Relative: 1 % (ref 0–5)
HCT: 39 % (ref 36.0–46.0)
Hemoglobin: 12.9 g/dL (ref 12.0–15.0)
Lymphocytes Relative: 8 % — ABNORMAL LOW (ref 12–46)
Lymphs Abs: 0.6 10*3/uL — ABNORMAL LOW (ref 0.7–4.0)
MCH: 25.9 pg — ABNORMAL LOW (ref 26.0–34.0)
MCHC: 33.1 g/dL (ref 30.0–36.0)
MCV: 78.3 fL (ref 78.0–100.0)
Monocytes Absolute: 0.8 10*3/uL (ref 0.1–1.0)
Monocytes Relative: 10 % (ref 3–12)
Neutro Abs: 6.7 10*3/uL (ref 1.7–7.7)
Neutrophils Relative %: 82 % — ABNORMAL HIGH (ref 43–77)
Platelets: 177 10*3/uL (ref 150–400)
RBC: 4.98 MIL/uL (ref 3.87–5.11)
RDW: 13.4 % (ref 11.5–15.5)
WBC: 8.1 10*3/uL (ref 4.0–10.5)

## 2012-05-11 LAB — LIPASE, BLOOD: Lipase: 23 U/L (ref 11–59)

## 2012-05-11 LAB — POCT PREGNANCY, URINE: Preg Test, Ur: NEGATIVE

## 2012-05-11 MED ORDER — ONDANSETRON HCL 4 MG/2ML IJ SOLN
4.0000 mg | Freq: Once | INTRAMUSCULAR | Status: AC
  Administered 2012-05-11: 4 mg via INTRAVENOUS
  Filled 2012-05-11: qty 2

## 2012-05-11 MED ORDER — ONDANSETRON 4 MG PO TBDP: 4.0000 mg | ORAL_TABLET | Freq: Three times a day (TID) | ORAL | Status: DC | PRN

## 2012-05-11 MED ORDER — SODIUM CHLORIDE 0.9 % IV SOLN
1000.0000 mL | Freq: Once | INTRAVENOUS | Status: AC
  Administered 2012-05-11: 1000 mL via INTRAVENOUS

## 2012-05-11 NOTE — ED Notes (Signed)
Pt c/o nausea since 6pm today. Also reports cough since last night. Denies vomiting or diarrhea. Also denies complaints of pain at this time.

## 2012-05-11 NOTE — ED Provider Notes (Signed)
History     CSN: [id number]  Arrival date & time 05/11/12  1827   First MD Initiated Contact with Patient 05/11/12 1845      Chief Complaint  Patient presents with  . Nausea    (Consider location/radiation/quality/duration/timing/severity/associated sxs/prior treatment) The history is provided by the patient. No language interpreter was used.   Pt is a 42yo female c/o sore throat x2 days relieved by ibuprofen, associated with nausea that started around 1800 this evening.  Also reports dry cough that started last night.  Denies any vomiting, diarrhea, or abdominal pain.  Denies fever.  States husband had head cold a few days ago w/o the nausea.  Relieved by OTC cold medications.  Pt denies hx of heart, lung, or abdominal problems.  Does mention she started taking Vit D 78mo ago. Denies recent travel.  Past Medical History  Diagnosis Date  . CIN I (cervical intraepithelial neoplasia I)   . Hyperparathyroidism, primary   . Hyperprolactinemia   . Hypocalcemia   . Umbilical hernia     Past Surgical History  Procedure Laterality Date  . Parathyroidectomy  2005    parathyroid adenoma. followed by dr. Gerrit Friends and dr. Altheimer.   . Hypoparathyroidism      History reviewed. No pertinent family history.  History  Substance Use Topics  . Smoking status: Never Smoker   . Smokeless tobacco: Never Used  . Alcohol Use: No    OB History   Grav Para Term Preterm Abortions TAB SAB Ect Mult Living   3 3 2       2       Review of Systems  Constitutional: Positive for fever ( subjective). Negative for chills.  HENT: Positive for sore throat and rhinorrhea.   Respiratory: Negative for cough, chest tightness and shortness of breath.   Cardiovascular: Negative for chest pain.  Gastrointestinal: Positive for nausea. Negative for vomiting, abdominal pain, diarrhea and constipation.    Allergies  Review of patient's allergies indicates no known allergies.  Home Medications   Current  Outpatient Rx  Name  Route  Sig  Dispense  Refill  . cabergoline (DOSTINEX) 0.5 MG tablet   Oral   Take 0.5 tablets (0.25 mg total) by mouth 2 (two) times a week.   30 tablet   11   . calcium carbonate (TUMS - DOSED IN MG ELEMENTAL CALCIUM) 500 MG chewable tablet   Oral   Chew 1 tablet by mouth daily.           . cholecalciferol (VITAMIN D) 1000 UNITS tablet   Oral   Take 1,000 Units by mouth daily.           . ferrous sulfate 325 (65 FE) MG tablet   Oral   Take 325 mg by mouth daily with breakfast.           . ondansetron (ZOFRAN ODT) 4 MG disintegrating tablet   Oral   Take 1 tablet (4 mg total) by mouth every 8 (eight) hours as needed for nausea.   10 tablet   0     BP 142/95  Pulse 100  Temp(Src) 98 F (36.7 C) (Oral)  Resp 20  SpO2 97%  LMP 04/15/2012  Physical Exam  Nursing note and vitals reviewed. Constitutional: She appears well-developed and well-nourished. No distress.  HENT:  Head: Normocephalic and atraumatic.  Mouth/Throat: Oropharynx is clear and moist. No oropharyngeal exudate.  Eyes: Conjunctivae are normal. No scleral icterus.  Neck: Normal range of motion.  No JVD present. No tracheal deviation present. No thyromegaly present.  Cardiovascular: Normal rate, regular rhythm and normal heart sounds.   Tachycardia   Pulmonary/Chest: Effort normal and breath sounds normal. No stridor. No respiratory distress. She has no wheezes. She has no rales. She exhibits no tenderness.  Tachypnea    Abdominal: Soft. Bowel sounds are normal. She exhibits no distension and no mass. There is no tenderness. There is no rebound and no guarding.  Musculoskeletal: Normal range of motion.  Lymphadenopathy:    She has no cervical adenopathy.  Neurological: She is alert.  Skin: Skin is warm and dry. She is not diaphoretic.    ED Course  Procedures (including critical care time)  Labs Reviewed  CBC WITH DIFFERENTIAL - Abnormal; Notable for the following:     MCH 25.9 (*)    Neutrophils Relative 82 (*)    Lymphocytes Relative 8 (*)    Lymphs Abs 0.6 (*)    All other components within normal limits  COMPREHENSIVE METABOLIC PANEL - Abnormal; Notable for the following:    Glucose, Bld 105 (*)    Calcium 7.3 (*)    Total Bilirubin 0.2 (*)    All other components within normal limits  LIPASE, BLOOD  URINALYSIS, MICROSCOPIC ONLY  POCT PREGNANCY, URINE   No results found.   1. Nausea   2. Hypocalcemia       MDM  Pt c/o sore throat x2 days associated with nausea that started around 1800 this evening.  Denies vomiting or diarrhea.  Sore throat has been relieved w/ ibuprofen.  Husband reports having head cold a few days ago w/o nausea.    Pt presented to ED tachycardic, tachipnic and elevated BP.  Recheck of vitals after given zofran and fluids Filed Vitals:   05/11/12 2005  BP: 142/95  Pulse: 100  Temp: 98 F (36.7 C)  Resp: 20   Labs: hypocalcemia. Pt reports having been placed on Vit D 84mo ago  Will have pt complete fluids challenge.  Likely discharge pt home and advise pt to stay well hydrated, f/u with PCP or urgent care in 2 days if symptoms persist or sooner if symptoms worsen.  Return to ED if unable to keep down fluids.   Pt afebrile, labs noncontributory, denies abdominal pain.  No emergent process taking place at this time.  No further workup necessary.  Likely viral in nature.   Rx: zofran.    Vitals: unremarkable. Discharged in stable condition.    Discussed pt with attending during ED encounter.     Junius Finner, PA-C 05/11/12 2049

## 2012-05-11 NOTE — ED Provider Notes (Signed)
Medical screening examination/treatment/procedure(s) were conducted as a shared visit with non-physician practitioner(s) and myself.  I personally evaluated the patient during the encounter  Sore throat and nausea x 2 days with dry cough.  Appears well, no distress. OP clear. Abdomen soft and nontender.  Glynn Octave, MD 05/11/12 [phone]

## 2012-05-11 NOTE — ED Notes (Signed)
Writer asked pt how pt is feeling after the fluid challenge, pt sts she is feeling much better.

## 2012-11-26 ENCOUNTER — Encounter (HOSPITAL_COMMUNITY): Payer: Self-pay | Admitting: Emergency Medicine

## 2012-11-26 ENCOUNTER — Emergency Department (HOSPITAL_COMMUNITY)
Admission: EM | Admit: 2012-11-26 | Discharge: 2012-11-26 | Disposition: A | Discharge status: Home / Self Care | Payer: 59 | Attending: Emergency Medicine | Admitting: Emergency Medicine

## 2012-11-26 DIAGNOSIS — Z79899 Other long term (current) drug therapy: Secondary | ICD-10-CM | POA: Insufficient documentation

## 2012-11-26 DIAGNOSIS — Z8719 Personal history of other diseases of the digestive system: Secondary | ICD-10-CM | POA: Insufficient documentation

## 2012-11-26 DIAGNOSIS — Z862 Personal history of diseases of the blood and blood-forming organs and certain disorders involving the immune mechanism: Secondary | ICD-10-CM | POA: Insufficient documentation

## 2012-11-26 DIAGNOSIS — B9789 Other viral agents as the cause of diseases classified elsewhere: Secondary | ICD-10-CM

## 2012-11-26 DIAGNOSIS — J069 Acute upper respiratory infection, unspecified: Secondary | ICD-10-CM | POA: Insufficient documentation

## 2012-11-26 DIAGNOSIS — Z8742 Personal history of other diseases of the female genital tract: Secondary | ICD-10-CM | POA: Insufficient documentation

## 2012-11-26 DIAGNOSIS — J988 Other specified respiratory disorders: Secondary | ICD-10-CM

## 2012-11-26 DIAGNOSIS — Z8639 Personal history of other endocrine, nutritional and metabolic disease: Secondary | ICD-10-CM | POA: Insufficient documentation

## 2012-11-26 NOTE — ED Notes (Signed)
Pt c/o cough, non productive, since yesterday. States she has nasal congestion today and now a headache. Pt has taken Motrin and Dimetapp as needed. Pt with no acute distress.

## 2012-11-26 NOTE — ED Provider Notes (Signed)
CSN: [id number]     Arrival date & time 11/26/12  2057 History  This chart was scribed for non-physician practitioner Kyung Bacca, PA-C working with Derwood Kaplan, MD by Caryn Bee, ED Scribe. This patient was seen in room WTR6/WTR6 and the patient's care was started at 10:15 PM.    Chief Complaint  Patient presents with  . Cough  . Nasal Congestion   HPI HPI Comments: [name] is a 42 y.o. female who presents to the Emergency Department complaining of gradual onset dry cough that began yesterday. Pt reports feeling cold today and complains of associated frontal headache. She reports the headache feels like a headaches she has had in the past, and she is currently pain-free. She denies recent head injury. She reports associated rhinorrhea, scratchy  Throat and post-nasal drip. Pt has taken Dimetapp and ibuprofen with mild relief. Pt denies vomiting, diarrhea, SOB, chest pain, weakness, body aches, dizziness and vision changes. Pt does not get flu vaccines. She works in a school. Pt denies medical history. Pt is from [place]. She has been in the [place] for 11 years. Pt's PCP is Dr. Robert Bellow.   Past Medical History  Diagnosis Date  . CIN I (cervical intraepithelial neoplasia I)   . Hyperparathyroidism, primary   . Hyperprolactinemia   . Hypocalcemia   . Umbilical hernia    Past Surgical History  Procedure Laterality Date  . Parathyroidectomy  2005    parathyroid adenoma. followed by dr. Gerrit Friends and dr. Altheimer.   . Hypoparathyroidism     No family history on file. History  Substance Use Topics  . Smoking status: Never Smoker   . Smokeless tobacco: Never Used  . Alcohol Use: No   OB History   Grav Para Term Preterm Abortions TAB SAB Ect Mult Living   3 3 2       2      Review of Systems  All other systems reviewed and are negative.    Allergies  Review of patient's allergies indicates no known allergies.  Home Medications   Current Outpatient Rx  Name  Route   Sig  Dispense  Refill  . brompheniramine-pseudoephedrine (DIMETAPP) 1-15 MG/5ML ELIX   Oral   Take 10 mLs by mouth 2 (two) times daily as needed for allergies.         . cabergoline (DOSTINEX) 0.5 MG tablet   Oral   Take 0.25 mg by mouth 2 (two) times a week.         . calcium carbonate (TUMS - DOSED IN MG ELEMENTAL CALCIUM) 500 MG chewable tablet   Oral   Chew 1 tablet by mouth daily.           . cholecalciferol (VITAMIN D) 1000 UNITS tablet   Oral   Take 1,000 Units by mouth daily.           . ferrous sulfate 325 (65 FE) MG tablet   Oral   Take 325 mg by mouth daily with breakfast.           . ibuprofen (ADVIL,MOTRIN) 200 MG tablet   Oral   Take 200 mg by mouth every 6 (six) hours as needed for fever or moderate pain.          Triage Vitals: BP 151/95  Pulse 96  Temp(Src) 99 F (37.2 C) (Oral)  Resp 16  SpO2 97%  Physical Exam  Nursing note and vitals reviewed. Constitutional: She is oriented to person, place, and time. She  appears well-developed and well-nourished. No distress.  HENT:  Head: Normocephalic and atraumatic.  Mouth/Throat: Oropharynx is clear and moist.  Bilateral TM and EAC nml.  No sinus tenderness.  Injection soft palate, tonsils and posterior pharynx w/out exudate.  Uvula midline and no trismus.  Eyes:  Normal appearance  Neck: Normal range of motion.  Cardiovascular: Normal rate and regular rhythm.   Pulmonary/Chest: Effort normal and breath sounds normal. No respiratory distress.  No coughing  Musculoskeletal: Normal range of motion.  Lymphadenopathy:    She has no cervical adenopathy.  Neurological: She is alert and oriented to person, place, and time.  Skin: Skin is warm and dry. No rash noted.  Psychiatric: She has a normal mood and affect. Her behavior is normal.    ED Course  Procedures (including critical care time) DIAGNOSTIC STUDIES: Oxygen Saturation is 97% on room air, normal by my interpretation.    COORDINATION  OF CARE: 10:25 PM-Discussed treatment plan with pt at bedside and pt agreed to plan.   Labs Review Labs Reviewed - No data to display Imaging Review No results found.  EKG Interpretation   None       MDM   1. Viral respiratory illness    Healthy 42yo F presents w/ cough, frontal headache, rhinorrhea, fatigue and chills x 24hrs.  Based on history, suspect viral respiratory illness.  Afebrile, welll-hydrated, no respiratory distress, nml breath sounds on exam.  She has had relief w/ ibuprofen and dimetapp.  Recommended that she continue these medications as well as sudafed and drink plenty of fluids.  Return precautions discussed.   I personally performed the services described in this documentation, which was scribed in my presence. The recorded information has been reviewed and is accurate.    Otilio Miu, PA-C 11/26/12 2238

## 2012-11-27 ENCOUNTER — Other Ambulatory Visit: Payer: Self-pay

## 2012-12-02 NOTE — ED Provider Notes (Signed)
Medical screening examination/treatment/procedure(s) were performed by non-physician practitioner and as supervising physician I was immediately available for consultation/collaboration.  EKG Interpretation   None        Derwood Kaplan, MD 12/02/12 [phone]

## 2013-01-05 ENCOUNTER — Ambulatory Visit (INDEPENDENT_AMBULATORY_CARE_PROVIDER_SITE_OTHER): Payer: 59 | Admitting: Gynecology

## 2013-01-05 ENCOUNTER — Encounter: Payer: Self-pay | Admitting: Gynecology

## 2013-01-05 VITALS — BP 132/86 | Ht 67.0 in | Wt 180.0 lb

## 2013-01-05 DIAGNOSIS — Z01419 Encounter for gynecological examination (general) (routine) without abnormal findings: Secondary | ICD-10-CM

## 2013-01-05 DIAGNOSIS — Z862 Personal history of diseases of the blood and blood-forming organs and certain disorders involving the immune mechanism: Secondary | ICD-10-CM

## 2013-01-05 DIAGNOSIS — Z8639 Personal history of other endocrine, nutritional and metabolic disease: Secondary | ICD-10-CM

## 2013-01-05 DIAGNOSIS — N912 Amenorrhea, unspecified: Secondary | ICD-10-CM

## 2013-01-05 LAB — PREGNANCY, URINE: Preg Test, Ur: NEGATIVE

## 2013-01-05 LAB — LIPID PANEL
Cholesterol: 222 mg/dL — ABNORMAL HIGH (ref 0–200)
HDL: 53 mg/dL (ref >39)
LDL Cholesterol: 136 mg/dL — ABNORMAL HIGH (ref 0–99)
Total CHOL/HDL Ratio: 4.2 Ratio
Triglycerides: 166 mg/dL — ABNORMAL HIGH (ref <150)
VLDL: 33 mg/dL (ref 0–40)

## 2013-01-05 MED ORDER — MEDROXYPROGESTERONE ACETATE 10 MG PO TABS: 10.0000 mg | ORAL_TABLET | Freq: Every day | ORAL | Status: DC

## 2013-01-05 MED ORDER — CABERGOLINE 0.5 MG PO TABS: 0.2500 mg | ORAL_TABLET | ORAL | Status: DC

## 2013-01-05 NOTE — Patient Instructions (Signed)
Cabergoline tablets What is this medicine? Cabergoline (ca BER goe leen) is used to treat high levels of prolactin. This is a hormone made by the body that affects things like the menstrual cycle, breast milk production, fertility, and sexual function. This medicine also treats high levels of prolactin caused by certain tumors. This medicine may be used for other purposes; ask your health care provider or pharmacist if you have questions. COMMON BRAND NAME(S): Dostinex What should I tell my health care provider before I take this medicine? They need to know if you have any of these conditions: -high or low blood pressure -liver disease -an unusual or allergic reaction to cabergoline, other medicines, foods, dyes, or preservatives -pregnant or trying to get pregnant -breast-feeding How should I use this medicine? Take this medicine by mouth with a glass of water. Follow the directions on the prescription label. This medicine may be taken with or without food. Take your doses at regular intervals. Do not take your medicine more often than directed. Talk to your pediatrician regarding the use of this medicine in children. Special care may be needed. Overdosage: If you think you have taken too much of this medicine contact a poison control center or emergency room at once. NOTE: This medicine is only for you. Do not share this medicine with others. What if I miss a dose? If you miss a dose, take it as soon as you can. If it is almost time for your next dose, take only that dose. Do not take double or extra doses. What may interact with this medicine? Do not take this medicine with any of the following medications: -certain medicines for HIV or AIDS -ergot medicines like dihydroergotamine, ergotamine, ergoloid mesylates, methysergide -droperidol -haloperidol -imatinib -metoclopramide -phenothiazines like chlorpromazine, mesoridazine, prochlorperazine, thioridazine This medicine may also  interact with the following medicines: -blood pressure medications -pimozide This list may not describe all possible interactions. Give your health care provider a list of all the medicines, herbs, non-prescription drugs, or dietary supplements you use. Also tell them if you smoke, drink alcohol, or use illegal drugs. Some items may interact with your medicine. What should I watch for while using this medicine? Visit your doctor or health care professional for regular checks on your progress. Your mouth may get dry. Chewing sugarless gum or sucking hard candy, and drinking plenty of water may help. Contact your doctor if the problem does not go away or is severe. This medicine can sometimes cause a temporary drop in blood pressure. This may happen suddenly causing dizziness or light headedness if you try to stand up too quickly. Make sure to get up slowly from a lying or sitting position while taking this medicine. You may get drowsy or dizzy. Do not drive, use machinery, or do anything that needs mental alertness until you know how this medicine affects you. Do not stand or sit up quickly, especially if you are an older patient. This reduces the risk of dizzy or fainting spells. Alcohol may interfere with the effect of this medicine. Avoid alcoholic drinks. What side effects may I notice from receiving this medicine? Side effects that you should report to your doctor as soon as possible: -allergic reactions like skin rash, itching or hives, swelling of the face, lips, or tongue -aggression, erratic behavior -breathing problems -confusion -nausea, vomiting -swelling in ankles -tingling or numbness in hands or feet -weakness Side effects that usually do not require medical attention (report to your doctor or health care professional if  they continue or are bothersome): -constipation or diarrhea -drowsiness -hair loss -headache -indigestion -low blood pressure -nervousness -pain in stomach  or abdomen -pain with menstruation This list may not describe all possible side effects. Call your doctor for medical advice about side effects. You may report side effects to FDA at 1-800-FDA-1088. Where should I keep my medicine? Keep out of the reach of children. Store at room temperature between 20 and 25 degrees C (68 and 77 degrees F). Throw away any unused medicine after the expiration date. NOTE: This sheet is a summary. It may not cover all possible information. If you have questions about this medicine, talk to your doctor, pharmacist, or health care provider.  2014, Elsevier/Gold Standard. (2007-04-15 14:53:41)

## 2013-01-05 NOTE — Progress Notes (Signed)
DEAIRA Berry 09-Sep-1970 [id number]   History:    42 y.o.  for annual gyn exam complaining of no menstrual cycle since June 8. Patient not using any form of contraception.Patient with history of primary hyperparathyroidism which was discovered in 2005. 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 Patient then had recurrent primary hyperparathyroidism and underwent right superior parathyroidectomy (adenoma), left superior parathyroidectomy (hyperplasia), and parathyroid tissue autotransplantation to the left sternocleidomastoid in 2009.She has had hypoparathyroidism.  She's also has had vitamin D deficiency and treated. Patient is currently taking vitamin D 50,000 units q. Monthly and she takes 12-16 tums a day for her calcium source. She's been followed by an endocrinologist Dr. Casimiro Needle Altheimer.  Patient also with history of hyperprolactinemia for which she has been on Cabergoline 0.5 mg twice a week. 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 She has been poorly  compliant on her Cabergoline (0.5 mg twice a week) and has not taken it in a few months. She denies any visual disturbances, galactorrhea or any unusual headaches.   Her bone density study March 2014 demonstrated that her Z-Score was within the expected range for her age. Her AP score was -1.5. When compared with previous study in 2010 there was a 9.4% bone mineralization at the AP spine and a 14.5% increase and bone mineralization of the left hip with no change in the left distal one third forearm.  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 She is not using any form of contraception. She is adamant on gain and other MRI all S. This prolactin level today is very high. Past medical history,surgical  history, family history and social history were all reviewed and documented in the EPIC chart.  Gynecologic History No LMP recorded. Contraception: none Last Pap: 2012. Results were: normal Last mammogram: 2014. Results were: normal  Obstetric History OB History  Gravida Para Term Preterm AB SAB TAB Ectopic Multiple Living  3 3 2       2     # Outcome Date GA Lbr Len/2nd Weight Sex Delivery Anes PTL Lv  3 PAR           2 TRM     M SVD  N Y  1 TRM     F SVD  N Y       ROS: A ROS was performed and pertinent positives and negatives are included in the history.  GENERAL: No fevers or chills. HEENT: No change in vision, no earache, sore throat or sinus congestion. NECK: No pain or stiffness. CARDIOVASCULAR: No chest pain or pressure. No palpitations. PULMONARY: No shortness of breath, cough or wheeze. GASTROINTESTINAL: No abdominal pain, nausea, vomiting or diarrhea, melena or bright red blood per rectum. GENITOURINARY: No urinary frequency, urgency, hesitancy or dysuria. MUSCULOSKELETAL: No joint or muscle pain, no back pain, no recent trauma. DERMATOLOGIC: No rash, no itching, no lesions. ENDOCRINE: No polyuria, polydipsia, no heat or cold intolerance. No recent change in weight. HEMATOLOGICAL: No anemia or easy bruising or bleeding. NEUROLOGIC: No headache, seizures, numbness, tingling or weakness. PSYCHIATRIC: No depression, no loss of interest in normal activity or change in sleep pattern.     Exam: chaperone present  BP 132/86  Ht 5\' 7"  (1.702 m)  Wt 180 lb (81.647 kg)  BMI 28.19 kg/m2  Body mass index is 28.19 kg/(m^2).  General appearance : Well developed well nourished female. No acute distress HEENT: Neck supple, trachea midline, no carotid bruits, no thyroidmegaly Lungs: Clear to auscultation, no rhonchi or wheezes, or rib retractions  Heart: Regular rate and rhythm, no murmurs or gallops Breast:Examined in sitting and supine position were symmetrical in appearance, no  palpable masses or tenderness,  no skin retraction, no nipple inversion, no nipple discharge, no skin discoloration, no axillary or supraclavicular lymphadenopathy Abdomen: no palpable masses or tenderness, no rebound or guarding Extremities: no edema or skin discoloration or tenderness  Pelvic:  Bartholin, Urethra, Skene Glands: Within normal limits             Vagina: No gross lesions or discharge  Cervix: No gross lesions or discharge  Uterus  anteverted, normal size, shape and consistency, non-tender and mobile  Adnexa  Without masses or tenderness  Anus and perineum  normal   Rectovaginal  normal sphincter tone without palpated masses or tenderness             Hemoccult not indicated   Urine pregnancy test today negative  Assessment/Plan:  42 y.o. female for annual exam who will have her prolactin level checked today since she has history of elevated prolactin levels. Patient will be represcribed Cabergoline 0.25 mg to take 1 tablet twice a week.Depending on the value we may need to proceed with a followup MRI. She denies any visual disturbances headaches or nipple discharge. We'll also do a screening cholesterol. She will continue to follow up with the endocrinologist every 6 months. We discussed importance of weightbearing exercises for osteoporosis prevention. She is currently on iron supplementation daily. We're also wanted to check her fasting lipid profile today. Her endocrinologist has done all her other lab work. We discussed the importance of taking prenatal vitamins in the event that she were to conceive. Pap smear not done today in accordance to the new guidelines.    Note: This dictation was prepared with  Dragon/digital dictation along withSmart phrase technology. Any transcriptional errors that result from this process are unintentional.   Ok Edwards MD, 1:37 PM 01/05/2013

## 2013-01-06 ENCOUNTER — Other Ambulatory Visit: Payer: Self-pay | Admitting: Gynecology

## 2013-01-06 DIAGNOSIS — R7989 Other specified abnormal findings of blood chemistry: Secondary | ICD-10-CM

## 2013-01-06 DIAGNOSIS — E7800 Pure hypercholesterolemia, unspecified: Secondary | ICD-10-CM

## 2013-01-06 LAB — PROLACTIN: Prolactin: 64.4 ng/mL

## 2013-03-02 ENCOUNTER — Other Ambulatory Visit: Payer: Self-pay

## 2013-03-02 DIAGNOSIS — Z1231 Encounter for screening mammogram for malignant neoplasm of breast: Secondary | ICD-10-CM

## 2013-03-15 ENCOUNTER — Other Ambulatory Visit: Payer: Self-pay | Admitting: Gynecology

## 2013-03-31 ENCOUNTER — Ambulatory Visit
Admission: RE | Admit: 2013-03-31 | Discharge: 2013-03-31 | Disposition: A | Discharge status: Home / Self Care | Payer: 59 | Source: Ambulatory Visit

## 2013-03-31 DIAGNOSIS — Z1231 Encounter for screening mammogram for malignant neoplasm of breast: Secondary | ICD-10-CM

## 2013-04-01 ENCOUNTER — Other Ambulatory Visit: Payer: Self-pay | Admitting: Gynecology

## 2013-04-01 DIAGNOSIS — R928 Other abnormal and inconclusive findings on diagnostic imaging of breast: Secondary | ICD-10-CM

## 2013-04-14 ENCOUNTER — Ambulatory Visit
Admission: RE | Admit: 2013-04-14 | Discharge: 2013-04-14 | Disposition: A | Discharge status: Home / Self Care | Payer: 59 | Source: Ambulatory Visit | Attending: Gynecology | Admitting: Gynecology

## 2013-04-14 DIAGNOSIS — R928 Other abnormal and inconclusive findings on diagnostic imaging of breast: Secondary | ICD-10-CM

## 2013-06-09 ENCOUNTER — Encounter: Payer: Self-pay | Admitting: Internal Medicine

## 2013-06-09 ENCOUNTER — Ambulatory Visit (INDEPENDENT_AMBULATORY_CARE_PROVIDER_SITE_OTHER): Payer: 59 | Admitting: Internal Medicine

## 2013-06-09 VITALS — BP 140/100 | HR 91 | Temp 97.9°F | Resp 16 | Ht 66.5 in | Wt 177.0 lb

## 2013-06-09 DIAGNOSIS — E049 Nontoxic goiter, unspecified: Secondary | ICD-10-CM

## 2013-06-09 DIAGNOSIS — Z79899 Other long term (current) drug therapy: Secondary | ICD-10-CM

## 2013-06-09 DIAGNOSIS — Z0000 Encounter for general adult medical examination without abnormal findings: Secondary | ICD-10-CM

## 2013-06-09 DIAGNOSIS — E229 Hyperfunction of pituitary gland, unspecified: Secondary | ICD-10-CM

## 2013-06-09 DIAGNOSIS — E221 Hyperprolactinemia: Secondary | ICD-10-CM

## 2013-06-09 DIAGNOSIS — E209 Hypoparathyroidism, unspecified: Secondary | ICD-10-CM

## 2013-06-09 DIAGNOSIS — R928 Other abnormal and inconclusive findings on diagnostic imaging of breast: Secondary | ICD-10-CM

## 2013-06-09 LAB — COMPREHENSIVE METABOLIC PANEL
ALT: 9 U/L (ref 0–35)
AST: 19 U/L (ref 0–37)
Albumin: 4.6 g/dL (ref 3.5–5.2)
Alkaline Phosphatase: 55 U/L (ref 39–117)
BUN: 12 mg/dL (ref 6–23)
CO2: 29 mEq/L (ref 19–32)
Calcium: 7.6 mg/dL — ABNORMAL LOW (ref 8.4–10.5)
Chloride: 101 mEq/L (ref 96–112)
Creat: 0.71 mg/dL (ref 0.50–1.10)
Glucose, Bld: 94 mg/dL (ref 70–99)
Potassium: 3.8 mEq/L (ref 3.5–5.3)
Sodium: 142 mEq/L (ref 135–145)
Total Bilirubin: 0.5 mg/dL (ref 0.2–1.2)
Total Protein: 7.1 g/dL (ref 6.0–8.3)

## 2013-06-09 LAB — POCT UA - MICROSCOPIC ONLY
Bacteria, U Microscopic: NEGATIVE
Casts, Ur, LPF, POC: NEGATIVE
Crystals, Ur, HPF, POC: NEGATIVE
Mucus, UA: NEGATIVE
RBC, urine, microscopic: NEGATIVE
Yeast, UA: NEGATIVE

## 2013-06-09 LAB — POCT CBC
Granulocyte percent: 64.2 %G (ref 37–80)
HCT, POC: 43.6 % (ref 37.7–47.9)
Hemoglobin: 13.6 g/dL (ref 12.2–16.2)
Lymph, poc: 1.7 (ref 0.6–3.4)
MCH, POC: 25.4 pg — AB (ref 27–31.2)
MCHC: 31.2 g/dL — AB (ref 31.8–35.4)
MCV: 81.3 fL (ref 80–97)
MID (cbc): 0.3 (ref 0–0.9)
MPV: 7.8 fL (ref 0–99.8)
POC Granulocyte: 3.7 (ref 2–6.9)
POC LYMPH PERCENT: 29.8 %L (ref 10–50)
POC MID %: 6 %M (ref 0–12)
Platelet Count, POC: 280 10*3/uL (ref 142–424)
RBC: 5.36 M/uL (ref 4.04–5.48)
RDW, POC: 14.8 %
WBC: 5.7 10*3/uL (ref 4.6–10.2)

## 2013-06-09 LAB — POCT URINALYSIS DIPSTICK
Bilirubin, UA: NEGATIVE
Blood, UA: NEGATIVE
Glucose, UA: NEGATIVE
Ketones, UA: NEGATIVE
Leukocytes, UA: NEGATIVE
Nitrite, UA: NEGATIVE
Protein, UA: NEGATIVE
Spec Grav, UA: 1.02
Urobilinogen, UA: 0.2
pH, UA: 7

## 2013-06-09 LAB — LIPID PANEL
Cholesterol: 229 mg/dL — ABNORMAL HIGH (ref 0–200)
HDL: 59 mg/dL (ref >39)
LDL Cholesterol: 146 mg/dL — ABNORMAL HIGH (ref 0–99)
Total CHOL/HDL Ratio: 3.9 Ratio
Triglycerides: 119 mg/dL (ref <150)
VLDL: 24 mg/dL (ref 0–40)

## 2013-06-09 LAB — TSH: TSH: 2.34 u[IU]/mL (ref 0.350–4.500)

## 2013-06-09 NOTE — Progress Notes (Signed)
   Subjective:    Patient ID: [name], female    DOB: [birth year], 43 y.o.   MRN: [id number]  HPI    Review of Systems     Objective:   Physical Exam        Assessment & Plan:

## 2013-06-09 NOTE — Progress Notes (Signed)
   Subjective:    Patient ID: [name], female    DOB: 1970/05/26, 43 y.o.   MRN: [id number]  HPI 43 year old female here for her yearly physical exam. She has no complaints with a history of Hyperprolactinemia treated by Dr. 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   Going to travel to  [place] soon.  Review of Systems  Constitutional: Negative.   HENT: Negative.   Eyes: Negative.   Respiratory: Negative.   Cardiovascular: Negative.   Gastrointestinal: Negative.   Endocrine: Negative.   Genitourinary: Negative.   Musculoskeletal: Negative.   Skin: Negative.   Allergic/Immunologic: Negative.   Neurological: Negative.   Hematological: Negative.   Psychiatric/Behavioral: Negative.    utd immunization    Objective:   Physical Exam  Constitutional: She appears well-developed and well-nourished. No distress.  HENT:  Head: Normocephalic.  Right Ear: External ear normal.  Left Ear: External ear normal.  Nose: Nose normal.  Mouth/Throat: Oropharynx is clear and moist.  Eyes: Conjunctivae and EOM are normal. Pupils are equal, round, and reactive to light. No scleral icterus.  Neck: Normal range of motion. Neck supple.  Cardiovascular: Normal rate, regular rhythm, normal heart sounds and intact distal pulses.   Pulmonary/Chest: Effort normal. She exhibits no mass, no tenderness and no swelling. Right breast exhibits no inverted nipple, no mass, no nipple discharge, no skin change and no tenderness. Left breast exhibits no inverted nipple, no mass, no nipple discharge, no skin change and no tenderness. Breasts are symmetrical.  Abdominal: Soft. Bowel sounds are normal.  Musculoskeletal: Normal range of motion.  Lymphadenopathy:    She has no cervical adenopathy.  Neurological: No cranial nerve deficit. She exhibits normal muscle tone. Coordination normal.  Psychiatric: She has a normal mood and affect. Her behavior is normal. Judgment and thought content  normal.   EKG RBBB,sinus Rhythm          Assessment & Plan:  Healthy Is [place] and wants Drs. Linna Darner and Daub to visit her community in [place]

## 2013-06-09 NOTE — Patient Instructions (Signed)
Immunization Schedule, Adult  Influenza vaccine.  All adults should be immunized every year.  All adults, including pregnant women and people with hives-only allergy to eggs can receive the inactivated influenza (IIV) vaccine.  Adults aged 45 49 years can receive the recombinant influenza (RIV) vaccine. The RIV vaccine does not contain any egg protein.  Adults aged 65 years or older can receive the standard-dose IIV or the high-dose IIV.  Tetanus, diphtheria, and acellular pertussis (Td, Tdap) vaccine.  Pregnant women should receive 1 dose of Tdap vaccine during each pregnancy. The dose should be obtained regardless of the length of time since the last dose. Immunization is preferred during the 27th to 36th week of gestation.  An adult who has not previously received Tdap or who does not know his or her vaccine status should receive 1 dose of Tdap. This initial dose should be followed by tetanus and diphtheria toxoids (Td) booster doses every 10 years.  Adults with an unknown or incomplete history of completing a 3-dose immunization series with Td-containing vaccines should begin or complete a primary immunization series including a Tdap dose.  Adults should receive a Td booster every 10 years.  Varicella vaccine.  An adult without evidence of immunity to varicella should receive 2 doses or a second dose if he or she has previously received 1 dose.  Pregnant females who do not have evidence of immunity should receive the first dose after pregnancy. This first dose should be obtained before leaving the health care facility. The second dose should be obtained 4 8 weeks after the first dose.  Human papillomavirus (HPV) vaccine.  Females aged 13 26 years who have not received the vaccine previously should obtain the 3-dose series.  The vaccine is not recommended for use in pregnant females. However, pregnancy testing is not needed before receiving a dose. If a female is found to be  pregnant after receiving a dose, no treatment is needed. In that case, the remaining doses should be delayed until after the pregnancy.  Males aged 13 21 years who have not received the vaccine previously should receive the 3-dose series. Males aged 22 26 years may be immunized.  Immunization is recommended through the age of 26 years for any female who has sex with males and did not get any or all doses earlier.  Immunization is recommended for any person with an immunocompromised condition through the age of 26 years if he or she did not get any or all doses earlier.  During the 3-dose series, the second dose should be obtained 4 8 weeks after the first dose. The third dose should be obtained 24 weeks after the first dose and 16 weeks after the second dose.  Zoster vaccine.  One dose is recommended for adults aged 60 years or older unless certain conditions are present.  Measles, mumps, and rubella (MMR) vaccine.  Adults born before 1957 generally are considered immune to measles and mumps.  Adults born in 1957 or later should have 1 or more doses of MMR vaccine unless there is a contraindication to the vaccine or there is laboratory evidence of immunity to each of the three diseases.  A routine second dose of MMR vaccine should be obtained at least 28 days after the first dose for students attending postsecondary schools, health care workers, or international travelers.  People who received inactivated measles vaccine or an unknown type of measles vaccine during 1963 1967 should receive 2 doses of MMR vaccine.  People who received   inactivated mumps vaccine or an unknown type of mumps vaccine before 1979 and are at high risk for mumps infection should consider immunization with 2 doses of MMR vaccine.  For females of childbearing age, rubella immunity should be determined. If there is no evidence of immunity, females who are not pregnant should be vaccinated. If there is no evidence of  immunity, females who are pregnant should delay immunization until after pregnancy.  Unvaccinated health care workers born before 45 who lack laboratory evidence of measles, mumps, or rubella immunity or laboratory confirmation of disease should consider measles and mumps immunization with 2 doses of MMR vaccine or rubella immunization with 1 dose of MMR vaccine.  Pneumococcal 13-valent conjugate (PCV13) vaccine.  When indicated, a person who is uncertain of his or her immunization history and has no record of immunization should receive the PCV13 vaccine.  An adult aged 38 years or older who has certain medical conditions and has not been previously immunized should receive 1 dose of PCV13 vaccine. This PCV13 should be followed with a dose of pneumococcal polysaccharide (PPSV23) vaccine. The PPSV23 vaccine dose should be obtained at least 8 weeks after the dose of PCV13 vaccine.  An adult aged 33 years or older who has certain medical conditions and previously received 1 or more doses of PPSV23 vaccine should receive 1 dose of PCV13. The PCV13 vaccine dose should be obtained 1 or more years after the last PPSV23 vaccine dose.  Pneumococcal polysaccharide (PPSV23) vaccine.  When PCV13 is also indicated, PCV13 should be obtained first.  All adults aged 71 years and older should be immunized.  An adult younger than age 74 years who has certain medical conditions should be immunized.  Any person who resides in a nursing home or long-term care facility should be immunized.  An adult smoker should be immunized.  People with an immunocompromised condition and certain other conditions should receive both PCV13 and PPSV23 vaccines.  People with human immunodeficiency virus (HIV) infection should be immunized as soon as possible after diagnosis.  Immunization during chemotherapy or radiation therapy should be avoided.  Routine use of PPSV23 vaccine is not recommended for American Indians,  [place], or people younger than 65 years unless there are medical conditions that require PPSV23 vaccine.  When indicated, people who have unknown immunization and have no record of immunization should receive PPSV23 vaccine.  One-time revaccination 5 years after the first dose of PPSV23 is recommended for people aged 22 64 years who have chronic kidney failure, nephrotic syndrome, asplenia, or immunocompromised conditions.  People who received 1 2 doses of PPSV23 before age 69 years should receive another dose of PPSV23 vaccine at age 70 years or later if at least 5 years have passed since the previous dose.  Doses of PPSV23 are not needed for people immunized with PPSV23 at or after age 66 years.  Meningococcal vaccine.  Adults with asplenia or persistent complement component deficiencies should receive 2 doses of quadrivalent meningococcal conjugate (MenACWY-D) vaccine. The doses should be obtained at least 2 months apart.  Microbiologists working with certain meningococcal bacteria, [employer], people at risk during an outbreak, and people who travel to or live in countries with a high rate of meningitis should be immunized.  A first-year college student up through age 74 years who is living in a residence hall should receive a dose if he or she did not receive a dose on or after his or her 16th birthday.  Adults who have  certain high-risk conditions should receive one or more doses of vaccine.  Hepatitis A vaccine.  Adults who wish to be protected from this disease, have certain high-risk conditions, work with hepatitis A-infected animals, work in hepatitis A research labs, or travel to or work in countries with a high rate of hepatitis A should be immunized.  Adults who were previously unvaccinated and who anticipate close contact with an international adoptee during the first 60 days after arrival in the [place] from a country with a high rate of hepatitis A should  be immunized.  Hepatitis B vaccine.  Adults who wish to be protected from this disease, have certain high-risk conditions, may be exposed to blood or other infectious body fluids, are household contacts or sex partners of hepatitis B positive people, are clients or workers in certain care facilities, or travel to or work in countries with a high rate of hepatitis B should be immunized.  Haemophilus influenzae type b (Hib) vaccine.  A previously unvaccinated person with asplenia or sickle cell disease or having a scheduled splenectomy should receive 1 dose of Hib vaccine.  Regardless of previous immunization, a recipient of a hematopoietic stem cell transplant should receive a 3-dose series 6 12 months after his or her successful transplant.  Hib vaccine is not recommended for adults with HIV infection. Document Released: 03/31/2003 Document Revised: 05/05/2012 Document Reviewed: 02/26/2012 [facility] Patient Information 2014 [place], [place]. Immunization Information for Foreign Travel Immunizations can protect you from certain diseases. Immunizations can also prevent the spread of certain infections. It is important to see your caregiver or a travel medicine specialist 4 6 weeks before you travel. This allows time for vaccines to take effect. It also provides enough time for you to get vaccines that must be given in a series over a period of days or weeks. Immunizations for travelers include:  Routine vaccines. These vaccines are standard for the people in a country.  Recommended vaccines. These vaccines are recommended before travel to some countries or regions.  Required vaccines. These vaccines are necessary before travel to specific countries or regions. If it is less than 4 weeks before you leave, you should still see your caregiver. You might still benefit from vaccines or medicines. WHAT ARE THE ROUTINE VACCINES? Routine vaccines can protect you from diseases that are common in many  parts of the world. Most routine vaccines are given at specific ages during your life. However, routine vaccines also include the annual flu (influenza) vaccine. You should be up-to-date on your routine immunizations before you travel. Your caregiver will be able to review your vaccine history and determine whether you have had all the routine vaccines. You may be advised to get extra doses or booster vaccines even if you are up-to-date on the routine vaccines. WHAT ARE THE RECOMMENDED VACCINES? Know your travel schedule when you visit your caregiver. The vaccines recommended before foreign travel will depend on several factors, including:  The country or countries of travel.  Whether you will travel to rural areas.  The length of time you will be traveling.  The season of the year.  Your age.  Your health status.  Your previous immunizations. Vaccine recommendations change over time. Your caregiver can tell you what vaccines are recommended before your trip. The annual influenza vaccine sometimes differs for the [place] and [place] hemispheres. Unless the annual vaccines are the same in both hemispheres, people with certain chronic medical conditions who are traveling to the other hemisphere shortly before or  during the influenza season should also get the other influenza vaccine. The other influenza vaccine should be obtained either before leaving the country or shortly after arrival at the travel site. WHAT ARE THE REQUIRED VACCINES? Vaccines may be required during a current outbreak of an infectious disease in a country or region. Your caregiver will be able to tell you about any current outbreaks and required vaccines. For example, proof of yellow fever immunization is currently required for most people before traveling to certain countries in [place] and [place]. This vaccine can only be obtained at approved centers. You should get the yellow fever vaccine at least 10 days before  your trip. After 10 days, most people show immunity to yellow fever. If it has been longer than 10 years since you received the yellow fever vaccine, another dose is required. If proof of immunization is incomplete or inaccurate, you could be quarantined, denied entry, or given another dose of vaccine at the travel site. If you cannot receive the yellow fever vaccine because of medical reasons, you must have a written statement from your caregiver. The statement must contain a medical reason for the lack of immunization. In such a case, your caregiver should then give you advice on how to decrease your chance of getting yellow fever. That advice should include taking precautions to avoid mosquito bites and limiting outdoor time. Other than having a medical condition or being under the age of 72 months, no other reasons will be accepted for not getting the vaccine.  Proof of meningococcal immunization is required by the [place] for any person older than 2 years who is taking part in the [place] or [place]. Visas for traveling to the [place] or [place] will not even be issued until there is proof of immunization. You should get this vaccine at least 10 days before your trip. After 10 days, most people show immunity. If it has been longer than 3 years since your last immunization, another dose is required. FOR MORE INFORMATION  Centers for Disease Control and Prevention (CDC): http://www.wolf.info/  World Health Organization [facility]): RoleLink.com.br Document Released: 12/27/2008 Document Revised: 12/26/2011 Document Reviewed: 12/07/2011 [facility] Patient Information 2014 [place], [place].

## 2013-06-11 LAB — IFOBT (OCCULT BLOOD): IFOBT: NEGATIVE

## 2013-06-11 NOTE — Addendum Note (Signed)
Addended by: Earl Lites on: 06/11/2013 12:53 PM   Modules accepted: Orders

## 2013-06-15 ENCOUNTER — Telehealth: Payer: Self-pay

## 2013-06-15 NOTE — Telephone Encounter (Signed)
Pt is returning our call for lab results

## 2013-06-18 NOTE — Telephone Encounter (Signed)
Lab results given

## 2013-09-21 ENCOUNTER — Other Ambulatory Visit: Payer: Self-pay | Admitting: Gynecology

## 2013-09-21 DIAGNOSIS — R921 Mammographic calcification found on diagnostic imaging of breast: Secondary | ICD-10-CM

## 2013-10-16 ENCOUNTER — Ambulatory Visit
Admission: RE | Admit: 2013-10-16 | Discharge: 2013-10-16 | Disposition: A | Discharge status: Home / Self Care | Payer: 59 | Source: Ambulatory Visit | Attending: Gynecology | Admitting: Gynecology

## 2013-10-16 DIAGNOSIS — R921 Mammographic calcification found on diagnostic imaging of breast: Secondary | ICD-10-CM

## 2013-11-23 ENCOUNTER — Encounter: Payer: Self-pay | Admitting: Internal Medicine

## 2014-01-27 ENCOUNTER — Ambulatory Visit (INDEPENDENT_AMBULATORY_CARE_PROVIDER_SITE_OTHER): Payer: 59 | Admitting: Gynecology

## 2014-01-27 ENCOUNTER — Encounter: Payer: Self-pay | Admitting: Gynecology

## 2014-01-27 ENCOUNTER — Other Ambulatory Visit (HOSPITAL_COMMUNITY)
Admission: RE | Admit: 2014-01-27 | Discharge: 2014-01-27 | Disposition: A | Discharge status: Home / Self Care | Payer: 59 | Source: Ambulatory Visit | Attending: Gynecology | Admitting: Gynecology

## 2014-01-27 VITALS — BP 134/86 | Ht 67.0 in | Wt 177.0 lb

## 2014-01-27 DIAGNOSIS — Z8639 Personal history of other endocrine, nutritional and metabolic disease: Secondary | ICD-10-CM

## 2014-01-27 DIAGNOSIS — Z01419 Encounter for gynecological examination (general) (routine) without abnormal findings: Secondary | ICD-10-CM

## 2014-01-27 DIAGNOSIS — E221 Hyperprolactinemia: Secondary | ICD-10-CM

## 2014-01-27 DIAGNOSIS — N911 Secondary amenorrhea: Secondary | ICD-10-CM

## 2014-01-27 DIAGNOSIS — Z1151 Encounter for screening for human papillomavirus (HPV): Secondary | ICD-10-CM | POA: Diagnosis present

## 2014-01-27 DIAGNOSIS — E209 Hypoparathyroidism, unspecified: Secondary | ICD-10-CM

## 2014-01-27 LAB — PREGNANCY, URINE: Preg Test, Ur: NEGATIVE

## 2014-01-27 MED ORDER — MEDROXYPROGESTERONE ACETATE 10 MG PO TABS: 10.0000 mg | ORAL_TABLET | Freq: Every day | ORAL | Status: AC

## 2014-01-27 MED ORDER — CABERGOLINE 0.5 MG PO TABS: 0.2500 mg | ORAL_TABLET | ORAL | Status: AC

## 2014-01-27 NOTE — Patient Instructions (Signed)
Cabergoline tablets What is this medicine? Cabergoline (ca BER goe leen) is used to treat high levels of prolactin. This is a hormone made by the body that affects things like the menstrual cycle, breast milk production, fertility, and sexual function. This medicine also treats high levels of prolactin caused by certain tumors. This medicine may be used for other purposes; ask your health care provider or pharmacist if you have questions. COMMON BRAND NAME(S): Dostinex What should I tell my health care provider before I take this medicine? They need to know if you have any of these conditions: -high or low blood pressure -liver disease -an unusual or allergic reaction to cabergoline, other medicines, foods, dyes, or preservatives -pregnant or trying to get pregnant -breast-feeding How should I use this medicine? Take this medicine by mouth with a glass of water. Follow the directions on the prescription label. This medicine may be taken with or without food. Take your doses at regular intervals. Do not take your medicine more often than directed. Talk to your pediatrician regarding the use of this medicine in children. Special care may be needed. Overdosage: If you think you have taken too much of this medicine contact a poison control center or emergency room at once. NOTE: This medicine is only for you. Do not share this medicine with others. What if I miss a dose? If you miss a dose, take it as soon as you can. If it is almost time for your next dose, take only that dose. Do not take double or extra doses. What may interact with this medicine? Do not take this medicine with any of the following medications: -certain medicines for HIV or AIDS -ergot medicines like dihydroergotamine, ergotamine, ergoloid mesylates, methysergide -droperidol -haloperidol -imatinib -metoclopramide -phenothiazines like chlorpromazine, mesoridazine, prochlorperazine, thioridazine This medicine may also  interact with the following medicines: -blood pressure medications -pimozide This list may not describe all possible interactions. Give your health care provider a list of all the medicines, herbs, non-prescription drugs, or dietary supplements you use. Also tell them if you smoke, drink alcohol, or use illegal drugs. Some items may interact with your medicine. What should I watch for while using this medicine? Visit your doctor or health care professional for regular checks on your progress. Your mouth may get dry. Chewing sugarless gum or sucking hard candy, and drinking plenty of water may help. Contact your doctor if the problem does not go away or is severe. This medicine can sometimes cause a temporary drop in blood pressure. This may happen suddenly causing dizziness or light headedness if you try to stand up too quickly. Make sure to get up slowly from a lying or sitting position while taking this medicine. You may get drowsy or dizzy. Do not drive, use machinery, or do anything that needs mental alertness until you know how this medicine affects you. Do not stand or sit up quickly, especially if you are an older patient. This reduces the risk of dizzy or fainting spells. Alcohol may interfere with the effect of this medicine. Avoid alcoholic drinks. What side effects may I notice from receiving this medicine? Side effects that you should report to your doctor as soon as possible: -allergic reactions like skin rash, itching or hives, swelling of the face, lips, or tongue -aggression, erratic behavior -breathing problems -confusion -nausea, vomiting -swelling in ankles -tingling or numbness in hands or feet -weakness Side effects that usually do not require medical attention (report to your doctor or health care professional if  they continue or are bothersome): -constipation or diarrhea -drowsiness -hair loss -headache -indigestion -low blood pressure -nervousness -pain in stomach  or abdomen -pain with menstruation This list may not describe all possible side effects. Call your doctor for medical advice about side effects. You may report side effects to FDA at 1-800-FDA-1088. Where should I keep my medicine? Keep out of the reach of children. Store at room temperature between 20 and 25 degrees C (68 and 77 degrees F). Throw away any unused medicine after the expiration date. NOTE: This sheet is a summary. It may not cover all possible information. If you have questions about this medicine, talk to your doctor, pharmacist, or health care provider.  2015, [employer]. (2007-04-15 14:53:41)

## 2014-01-27 NOTE — Addendum Note (Signed)
Addended by: Thurnell Garbe A on: 01/27/2014 12:44 PM   Modules accepted: Orders, SmartSet

## 2014-01-27 NOTE — Addendum Note (Signed)
Addended by: Thurnell Garbe A on: 01/27/2014 11:41 AM   Modules accepted: Orders, SmartSet

## 2014-01-27 NOTE — Progress Notes (Signed)
[name] March 29, 1970 [id number]   History:    44 y.o.  for annual gyn exam without any complaints with the exception of amenorrhea since June 2015. Patient has not been using any form of contraception. She denies any visual disturbances, headaches, or nipple discharge.Patient with history of primary hyperparathyroidism which was discovered in 2005. 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 Patient then had recurrent primary hyperparathyroidism and underwent right superior parathyroidectomy (adenoma), left superior parathyroidectomy (hyperplasia), and parathyroid tissue autotransplantation to the left sternocleidomastoid in 2009.She has had hypoparathyroidism. She has had history vitamin D deficiency for which she currently takes 50,000 units every monthly. She also takes 12-16 tums a day for her calcium source. She's been followed by an endocrinologist Dr. Legrand Como Altheimer. Who recently did all her lab work but she states that her prolactin level was not checked. Patient does have a history of hyperprolactinemia for which she has been on Cabergoline 0.5 mg twice a week. Patient has had issues with compliance and has not been taking medication on a regular basis which may explain her secondary amenorrhea.Her last MRI was in 2010 noted a possible small cyst measuring 5 x 7 x 6 mm near the pituitary gland unchanged since 2006.  Her bone density study March 2014 demonstrated that her Z-Score was within the expected range for her age. Her AP score was -1.5. When compared with previous study in 2010 there was a 9.4% bone mineralization at the AP spine and a 14.5% increase and bone mineralization of the left hip with no change in the left distal one third forearm.  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  Patient is moving to [place] later this month. Patient with no past history of any abnormal Pap  smear.  Past medical history,surgical history, family history and social history were all reviewed and documented in the EPIC chart.  Gynecologic History No LMP recorded. Patient is not currently having periods (Reason: Perimenopausal). Contraception: none Last Pap: 2012. Results were: normal Last mammogram: 2015. Results were: Schedule for follow-up in March as a result a small benign appearing calcifications on the left breast.  Obstetric History OB History  Gravida Para Term Preterm AB SAB TAB Ectopic Multiple Living  3 3 2       2     # Outcome Date GA Lbr Len/2nd Weight Sex Delivery Anes PTL Lv  3 Para           2 Term     M Vag-Spont  N Y  1 Term     F Vag-Spont  N Y       ROS: A ROS was performed and pertinent positives and negatives are included in the history.  GENERAL: No fevers or chills. HEENT: No change in vision, no earache, sore throat or sinus congestion. NECK: No pain or stiffness. CARDIOVASCULAR: No chest pain or pressure. No palpitations. PULMONARY: No shortness of breath, cough or wheeze. GASTROINTESTINAL: No abdominal pain, nausea, vomiting or diarrhea, melena or bright red blood per rectum. GENITOURINARY: No urinary frequency, urgency, hesitancy or dysuria. MUSCULOSKELETAL: No joint or muscle pain, no back pain, no recent trauma. DERMATOLOGIC: No rash, no itching, no lesions. ENDOCRINE: No polyuria, polydipsia, no heat or cold intolerance. No recent change in weight. HEMATOLOGICAL: No anemia or easy bruising or bleeding. NEUROLOGIC: No headache, seizures, numbness, tingling or weakness. PSYCHIATRIC: No depression, no loss of interest in normal activity  or change in sleep pattern.     Exam: chaperone present  BP 134/86 mmHg  Ht 5\' 7"  (1.702 m)  Wt 177 lb (80.287 kg)  BMI 27.72 kg/m2  Body mass index is 27.72 kg/(m^2).  General appearance : Well developed well nourished female. No acute distress HEENT: Neck supple, trachea midline, no carotid bruits, no  thyroidmegaly Lungs: Clear to auscultation, no rhonchi or wheezes, or rib retractions  Heart: Regular rate and rhythm, no murmurs or gallops Breast:Examined in sitting and supine position were symmetrical in appearance, no palpable masses or tenderness,  no skin retraction, no nipple inversion, no nipple discharge, no skin discoloration, no axillary or supraclavicular lymphadenopathy Abdomen: no palpable masses or tenderness, no rebound or guarding Extremities: no edema or skin discoloration or tenderness  Pelvic:  Bartholin, Urethra, Skene Glands: Within normal limits             Vagina: No gross lesions or discharge  Cervix: No gross lesions or discharge  Uterus  anteverted, normal size, shape and consistency, non-tender and mobile  Adnexa  Without masses or tenderness  Anus and perineum  normal   Rectovaginal  normal sphincter tone without palpated masses or tenderness             Hemoccult not indicated   Urine pregnancy test negative  Assessment/Plan:  44 y.o. female for annual exam who will have her prolactin level checked today since she has history of elevated prolactin levels. Patient will be represcribed Cabergoline 0.25 mg to take 1 tablet twice a week.Depending on the value we may need to proceed with a followup MRI. She denies any visual disturbances headaches or nipple discharge. Patient is moving to [place] later this month. A copy of her recent mammogram and bone density study as well as this office note will be provided for her to take with her to her new primary physician when she moves to [place]. The other lab work was done by her primary care physician. She was reminded on the importance of monthly self breast examination to follow-up with a mammogram (bilateral diagnostic) disease in March as had been recommended. She will also need a bone density study later this year. Pap smear was done today. She will be prescribed Provera 10 mg to take 1 by mouth daily for her to have her  withdrawal bleed now that her urine patency test was negative. Refills were provided in the event that she does not have a spontaneous menses every 30 days. She was reminded on the importance of taking her Cabergoline twice a week as previously prescribed.    Terrance Mass MD, 11:29 AM 01/27/2014

## 2014-01-28 LAB — PROLACTIN: Prolactin: 65.5 ng/mL

## 2014-01-29 LAB — CYTOLOGY - PAP

## 2014-04-24 ENCOUNTER — Other Ambulatory Visit: Payer: Self-pay | Admitting: Gynecology

## 2016-06-06 ENCOUNTER — Encounter: Payer: Self-pay | Admitting: Gynecology

## 2018-02-07 DIAGNOSIS — C50919 Malignant neoplasm of unspecified site of unspecified female breast: Secondary | ICD-10-CM

## 2018-02-07 HISTORY — PX: BREAST LUMPECTOMY: SHX2

## 2018-02-07 HISTORY — DX: Malignant neoplasm of unspecified site of unspecified female breast: C50.919

## 2019-02-11 DIAGNOSIS — E039 Hypothyroidism, unspecified: Secondary | ICD-10-CM | POA: Diagnosis not present

## 2019-02-11 DIAGNOSIS — E559 Vitamin D deficiency, unspecified: Secondary | ICD-10-CM | POA: Diagnosis not present

## 2019-02-11 DIAGNOSIS — I10 Essential (primary) hypertension: Secondary | ICD-10-CM | POA: Diagnosis not present

## 2019-02-11 DIAGNOSIS — E348 Other specified endocrine disorders: Secondary | ICD-10-CM | POA: Diagnosis not present

## 2019-02-18 ENCOUNTER — Other Ambulatory Visit: Payer: Self-pay | Admitting: Family Medicine

## 2019-02-18 DIAGNOSIS — Z1231 Encounter for screening mammogram for malignant neoplasm of breast: Secondary | ICD-10-CM

## 2019-02-26 ENCOUNTER — Other Ambulatory Visit: Payer: Self-pay | Admitting: Family Medicine

## 2019-02-26 ENCOUNTER — Other Ambulatory Visit (HOSPITAL_COMMUNITY)
Admission: RE | Admit: 2019-02-26 | Discharge: 2019-02-26 | Disposition: A | Discharge status: Home / Self Care | Payer: BC Managed Care – PPO | Source: Ambulatory Visit | Attending: Family Medicine | Admitting: Family Medicine

## 2019-02-26 DIAGNOSIS — Z124 Encounter for screening for malignant neoplasm of cervix: Secondary | ICD-10-CM | POA: Diagnosis not present

## 2019-02-26 DIAGNOSIS — I10 Essential (primary) hypertension: Secondary | ICD-10-CM | POA: Diagnosis not present

## 2019-02-26 DIAGNOSIS — Z01419 Encounter for gynecological examination (general) (routine) without abnormal findings: Secondary | ICD-10-CM | POA: Diagnosis not present

## 2019-03-02 ENCOUNTER — Ambulatory Visit
Admission: RE | Admit: 2019-03-02 | Discharge: 2019-03-02 | Disposition: A | Discharge status: Home / Self Care | Payer: BC Managed Care – PPO | Source: Ambulatory Visit | Attending: Family Medicine | Admitting: Family Medicine

## 2019-03-02 ENCOUNTER — Other Ambulatory Visit: Payer: Self-pay | Admitting: Family Medicine

## 2019-03-02 ENCOUNTER — Other Ambulatory Visit: Payer: Self-pay

## 2019-03-02 DIAGNOSIS — M25512 Pain in left shoulder: Secondary | ICD-10-CM | POA: Diagnosis not present

## 2019-03-03 ENCOUNTER — Other Ambulatory Visit: Payer: Self-pay | Admitting: Family Medicine

## 2019-03-03 DIAGNOSIS — Z853 Personal history of malignant neoplasm of breast: Secondary | ICD-10-CM

## 2019-03-03 DIAGNOSIS — Z1231 Encounter for screening mammogram for malignant neoplasm of breast: Secondary | ICD-10-CM

## 2019-03-03 LAB — CYTOLOGY - PAP
Comment: NEGATIVE
Diagnosis: NEGATIVE
High risk HPV: NEGATIVE

## 2019-03-18 ENCOUNTER — Other Ambulatory Visit: Payer: Self-pay

## 2019-03-18 ENCOUNTER — Ambulatory Visit
Admission: RE | Admit: 2019-03-18 | Discharge: 2019-03-18 | Disposition: A | Discharge status: Home / Self Care | Payer: BC Managed Care – PPO | Source: Ambulatory Visit | Attending: Family Medicine | Admitting: Family Medicine

## 2019-03-18 DIAGNOSIS — Z853 Personal history of malignant neoplasm of breast: Secondary | ICD-10-CM

## 2019-03-18 DIAGNOSIS — R928 Other abnormal and inconclusive findings on diagnostic imaging of breast: Secondary | ICD-10-CM | POA: Diagnosis not present

## 2019-03-24 DIAGNOSIS — I10 Essential (primary) hypertension: Secondary | ICD-10-CM | POA: Diagnosis not present

## 2019-03-24 DIAGNOSIS — E348 Other specified endocrine disorders: Secondary | ICD-10-CM | POA: Diagnosis not present

## 2019-03-24 DIAGNOSIS — D442 Neoplasm of uncertain behavior of parathyroid gland: Secondary | ICD-10-CM | POA: Diagnosis not present

## 2019-03-31 DIAGNOSIS — H353110 Nonexudative age-related macular degeneration, right eye, stage unspecified: Secondary | ICD-10-CM | POA: Diagnosis not present

## 2019-05-11 DIAGNOSIS — I10 Essential (primary) hypertension: Secondary | ICD-10-CM | POA: Diagnosis not present

## 2019-05-11 DIAGNOSIS — H353110 Nonexudative age-related macular degeneration, right eye, stage unspecified: Secondary | ICD-10-CM | POA: Diagnosis not present

## 2020-03-16 ENCOUNTER — Other Ambulatory Visit: Payer: Self-pay | Admitting: Nurse Practitioner

## 2020-03-16 DIAGNOSIS — Z9889 Other specified postprocedural states: Secondary | ICD-10-CM

## 2020-04-22 ENCOUNTER — Other Ambulatory Visit: Payer: Self-pay | Admitting: Family Medicine

## 2020-04-27 ENCOUNTER — Ambulatory Visit
Admission: RE | Admit: 2020-04-27 | Discharge: 2020-04-27 | Disposition: A | Discharge status: Home / Self Care | Payer: BC Managed Care – PPO | Source: Ambulatory Visit | Attending: Nurse Practitioner | Admitting: Nurse Practitioner

## 2020-04-27 ENCOUNTER — Other Ambulatory Visit: Payer: Self-pay

## 2020-04-27 DIAGNOSIS — Z9889 Other specified postprocedural states: Secondary | ICD-10-CM

## 2020-06-15 IMAGING — MG DIGITAL DIAGNOSTIC BILAT W/ TOMO W/ CAD
6 of 9 series · 6 of 25 positions shown · non-contrast
Comparison: 02/07/2018 and earlier studies

CLINICAL DATA: Patient had LEFT lumpectomy with radiation therapy
for DCIS in January 2018. Asymptomatic today.

EXAM:
DIGITAL DIAGNOSTIC BILATERAL MAMMOGRAM WITH CAD AND TOMO

[L MLO]
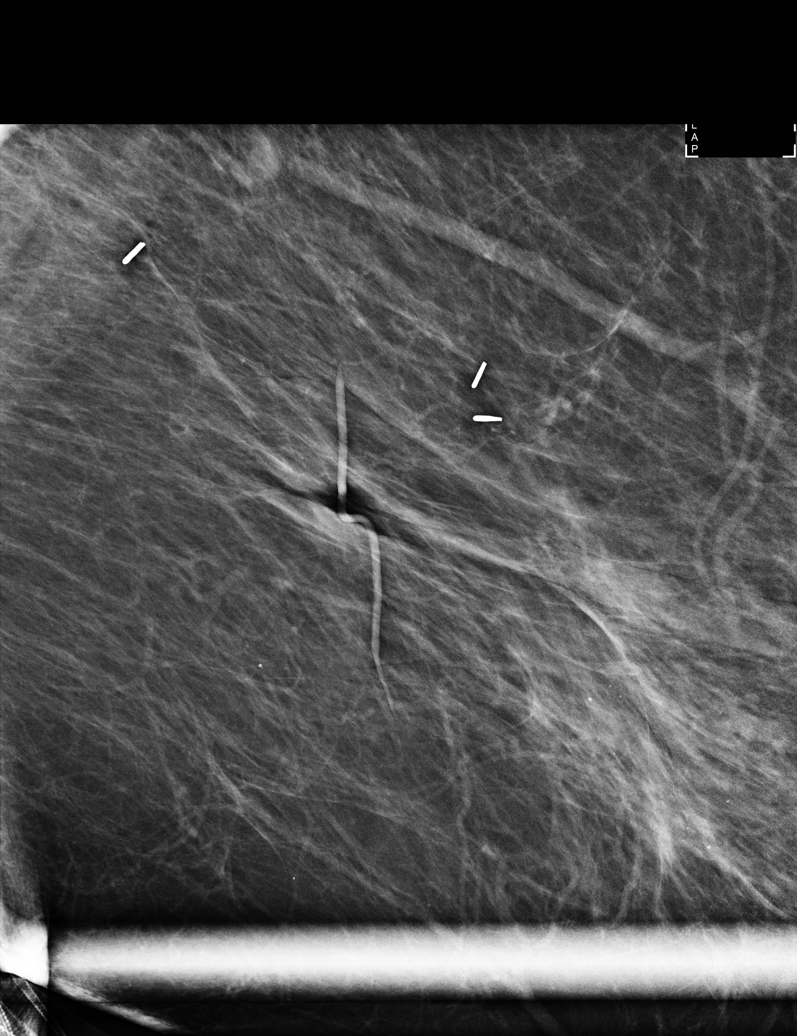

[L MLO synth-2D]
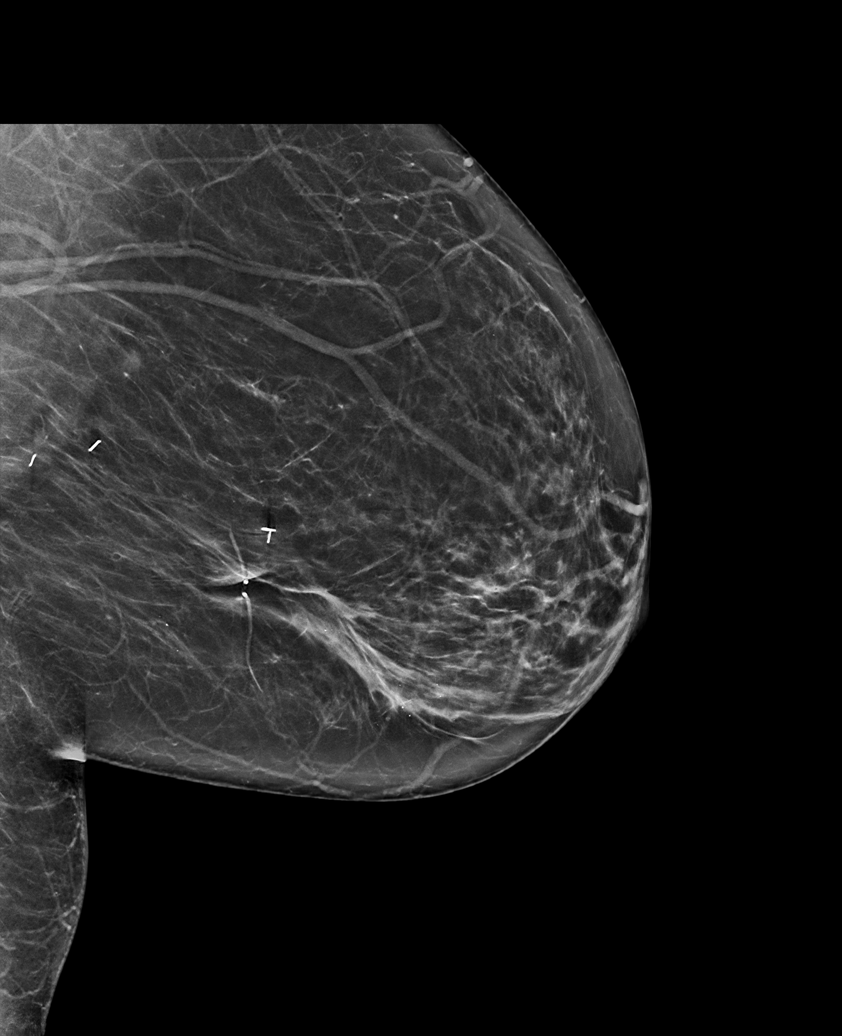

[R MLO synth-2D]
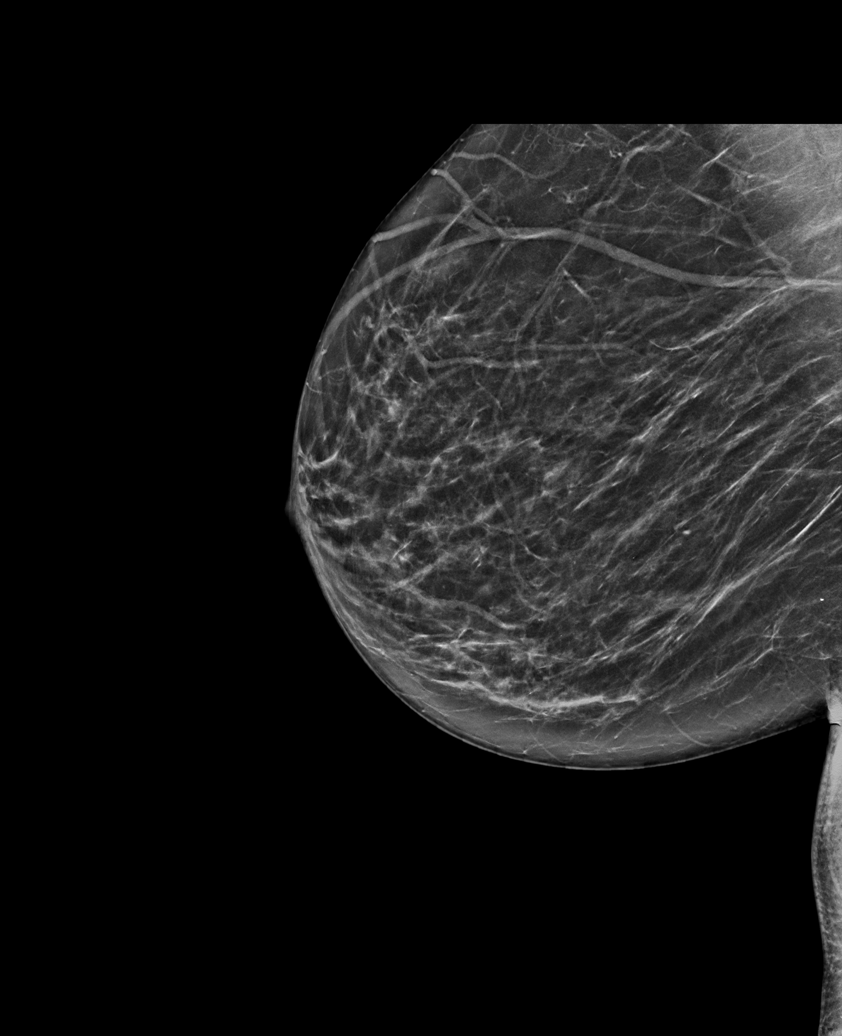

[R CC synth-2D]
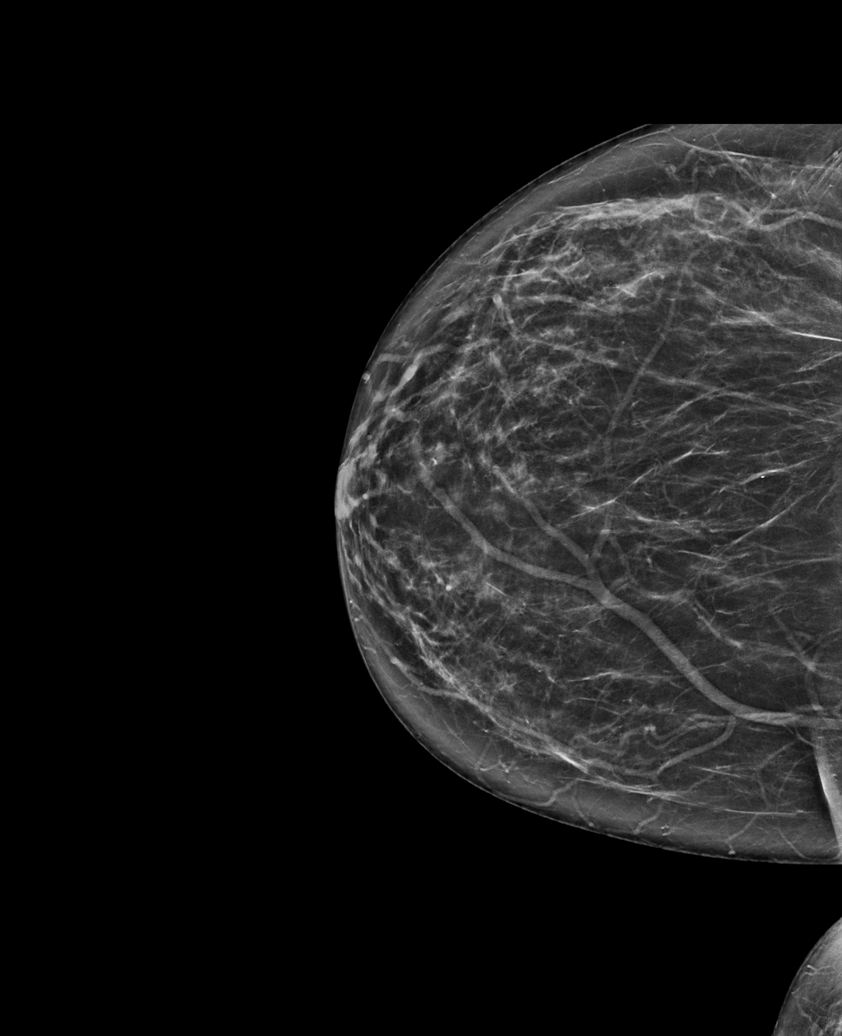

[L CC synth-2D]
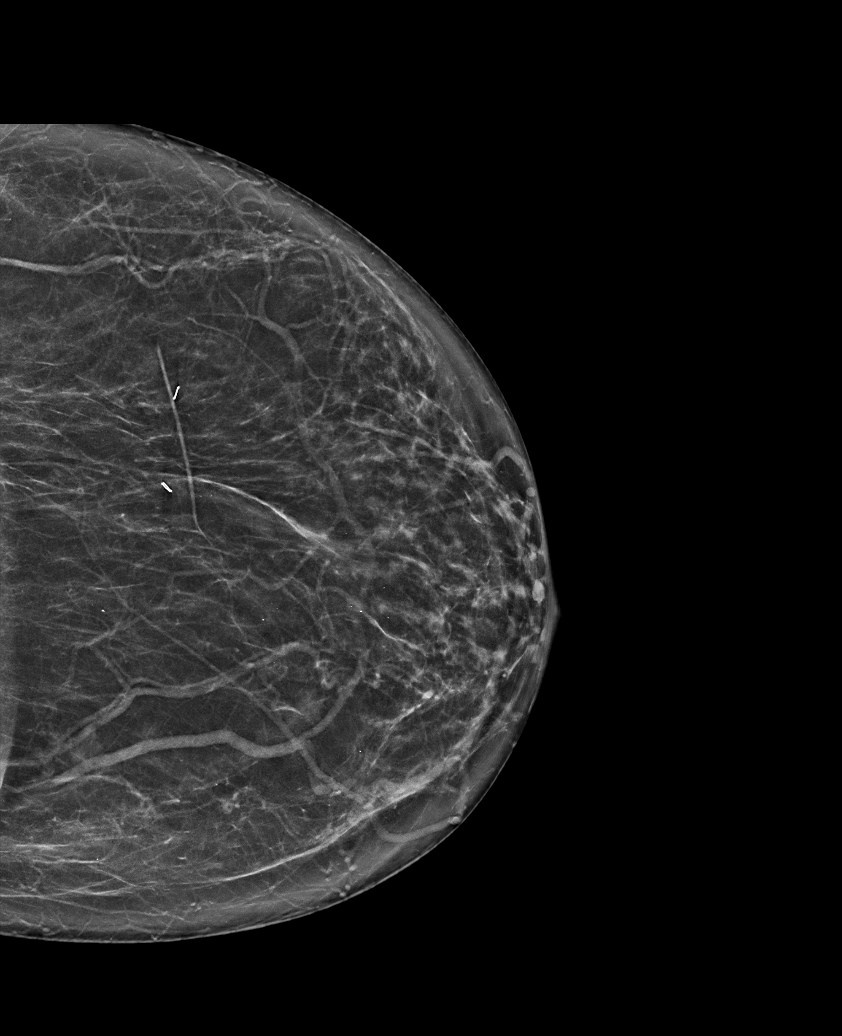

[R CC tomo · tomo slice 36/71.0]
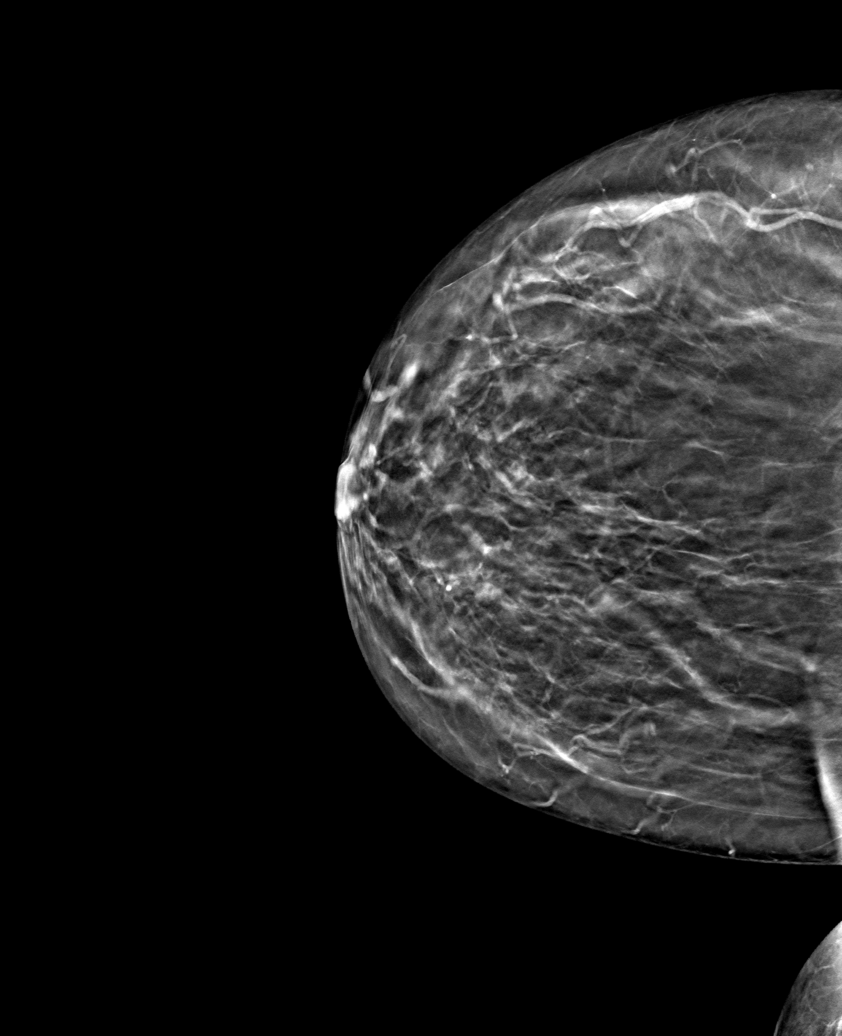

[6 of 25 positions shown; findings below may reference images not displayed]

ACR Breast Density Category b: There are scattered areas of
fibroglandular density.
FINDINGS: Postoperative changes are identified in the LOWER central portion of
the LEFT breast. No suspicious mass, distortion, or
microcalcifications are identified to suggest presence of
malignancy. Magnified view of the lumpectomy site is unremarkable.

Mammographic images were processed with CAD.
IMPRESSION: No mammographic evidence for malignancy.

RECOMMENDATION:
Diagnostic mammogram is suggested in 1 year. (Code:[id number]-[id number])

I have discussed the findings and recommendations with the patient.
If applicable, a reminder letter will be sent to the patient
regarding the next appointment.

BI-RADS CATEGORY  1: Negative.

## 2021-03-27 ENCOUNTER — Other Ambulatory Visit: Payer: Self-pay | Admitting: *Deleted

## 2021-03-27 ENCOUNTER — Other Ambulatory Visit: Payer: Self-pay | Admitting: Nurse Practitioner

## 2021-03-27 DIAGNOSIS — Z1231 Encounter for screening mammogram for malignant neoplasm of breast: Secondary | ICD-10-CM

## 2021-04-28 ENCOUNTER — Ambulatory Visit
Admission: RE | Admit: 2021-04-28 | Discharge: 2021-04-28 | Disposition: A | Discharge status: Home / Self Care | Payer: BC Managed Care – PPO | Source: Ambulatory Visit | Attending: *Deleted | Admitting: *Deleted

## 2021-04-28 DIAGNOSIS — Z1231 Encounter for screening mammogram for malignant neoplasm of breast: Secondary | ICD-10-CM

## 2021-05-01 ENCOUNTER — Other Ambulatory Visit: Payer: Self-pay | Admitting: *Deleted

## 2021-05-01 DIAGNOSIS — R928 Other abnormal and inconclusive findings on diagnostic imaging of breast: Secondary | ICD-10-CM

## 2021-05-04 ENCOUNTER — Ambulatory Visit
Admission: RE | Admit: 2021-05-04 | Discharge: 2021-05-04 | Disposition: A | Discharge status: Home / Self Care | Payer: BC Managed Care – PPO | Source: Ambulatory Visit | Attending: *Deleted | Admitting: *Deleted

## 2021-05-04 ENCOUNTER — Other Ambulatory Visit: Payer: Self-pay | Admitting: *Deleted

## 2021-05-04 DIAGNOSIS — R921 Mammographic calcification found on diagnostic imaging of breast: Secondary | ICD-10-CM

## 2021-05-04 DIAGNOSIS — R928 Other abnormal and inconclusive findings on diagnostic imaging of breast: Secondary | ICD-10-CM

## 2023-11-14 ENCOUNTER — Other Ambulatory Visit: Payer: Self-pay

## 2023-11-14 DIAGNOSIS — M25562 Pain in left knee: Secondary | ICD-10-CM

## 2024-02-25 ENCOUNTER — Other Ambulatory Visit: Payer: Self-pay

## 2024-02-25 DIAGNOSIS — R921 Mammographic calcification found on diagnostic imaging of breast: Secondary | ICD-10-CM

## 2024-03-06 ENCOUNTER — Encounter

## 2024-03-06 ENCOUNTER — Other Ambulatory Visit
